# Patient Record
Sex: Male | Born: 1985 | Race: Black or African American | Hispanic: No | Marital: Single | Smoking: Current every day smoker
Health system: Southern US, Community
[De-identification: ages and names within clinical notes are randomized; demographics above are authoritative.]

## PROBLEM LIST (undated history)

## (undated) DIAGNOSIS — M109 Gout, unspecified: Secondary | ICD-10-CM

---
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
## 2006-06-07 ENCOUNTER — Emergency Department (HOSPITAL_COMMUNITY): Admission: EM | Admit: 2006-06-07 | Discharge: 2006-06-07 | Payer: Self-pay | Admitting: Emergency Medicine
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
## 2014-05-11 ENCOUNTER — Encounter (HOSPITAL_COMMUNITY): Payer: Self-pay | Admitting: Emergency Medicine

## 2014-05-11 ENCOUNTER — Emergency Department (HOSPITAL_COMMUNITY)
Admission: EM | Admit: 2014-05-11 | Discharge: 2014-05-11 | Disposition: A | Discharge status: Home / Self Care | Payer: Self-pay | Attending: Emergency Medicine | Admitting: Emergency Medicine

## 2014-05-11 DIAGNOSIS — R000 Tachycardia, unspecified: Secondary | ICD-10-CM | POA: Insufficient documentation

## 2014-05-11 DIAGNOSIS — F17200 Nicotine dependence, unspecified, uncomplicated: Secondary | ICD-10-CM | POA: Insufficient documentation

## 2014-05-11 DIAGNOSIS — J329 Chronic sinusitis, unspecified: Secondary | ICD-10-CM | POA: Insufficient documentation

## 2014-05-11 MED ORDER — SULFAMETHOXAZOLE-TRIMETHOPRIM 800-160 MG PO TABS: 1.0000 | ORAL_TABLET | Freq: Two times a day (BID) | ORAL | Status: DC

## 2014-05-11 NOTE — ED Provider Notes (Signed)
Medical screening examination/treatment/procedure(s) were performed by non-physician practitioner and as supervising physician I was immediately available for consultation/collaboration.   EKG Interpretation None        Dagmar Hait, MD 05/11/14 [phone]

## 2014-05-11 NOTE — Discharge Instructions (Signed)
Take Bactrim as directed until gone. Refer to attached documents for more information.  °

## 2014-05-11 NOTE — ED Notes (Signed)
Pt reports that he has had a cough and congestion over the past couple of days. Reports that he has fullness in his sinuses

## 2014-05-11 NOTE — ED Provider Notes (Signed)
CSN: [id number]     Arrival date & time 05/11/14  1454 History  This chart was scribed for non-physician practitioner Emilia Beck, PA-C working with Dagmar Hait, MD by Dorothey Baseman, ED Scribe. This patient was seen in room TR06C/TR06C and the patient's care was started at 3:30 PM.    Chief Complaint  Patient presents with  . Nasal Congestion  . Cough   Patient is a 28 y.o. male presenting with cough. The history is provided by the patient. No language interpreter was used.  Cough Cough characteristics:  Dry Severity:  Moderate Onset quality:  Gradual Timing:  Intermittent Progression:  Unchanged Chronicity:  New Smoker: yes   Relieved by:  Nothing Ineffective treatments: OTC medications. Associated symptoms: rhinorrhea, sinus congestion and sore throat   Rhinorrhea:    Quality:  Clear   Severity:  Mild   Timing:  Intermittent   Progression:  Unchanged Sore throat:    Severity:  Moderate   Onset quality:  Gradual   Timing:  Constant   Progression:  Unchanged  HPI Comments: Jesus Phillips is a 28 y.o. male who presents to the Emergency Department complaining of a dry cough with associated sore throat, congestion, rhinorrhea, and sinus pressure onset 2 days ago. He reports using using OTC medications at home without significant relief. Patient has no other pertinent medical history.   History reviewed. No pertinent past medical history. History reviewed. No pertinent past surgical history. History reviewed. No pertinent family history. History  Substance Use Topics  . Smoking status: Current Every Day Smoker    Types: Cigarettes  . Smokeless tobacco: Not on file  . Alcohol Use: Yes    Review of Systems  HENT: Positive for congestion, rhinorrhea, sinus pressure and sore throat.   Respiratory: Positive for cough.   All other systems reviewed and are negative.  Allergies  Review of patient's allergies indicates no known allergies.  Home Medications    Prior to Admission medications   Not on File   Triage Vitals: BP 136/75  Pulse 117  Temp(Src) 99.1 F (37.3 C) (Oral)  Resp 18  SpO2 97%  Physical Exam  Nursing note and vitals reviewed. Constitutional: He is oriented to person, place, and time. He appears well-developed and well-nourished. No distress.  HENT:  Head: Normocephalic and atraumatic.  Frontal sinus tenderness to palpation.   Eyes: Conjunctivae are normal.  Neck: Normal range of motion. Neck supple.  Cardiovascular: Regular rhythm and normal heart sounds.   Tachycardic.   Pulmonary/Chest: Effort normal and breath sounds normal. No respiratory distress.  Abdominal: He exhibits no distension.  Musculoskeletal: Normal range of motion.  Neurological: He is alert and oriented to person, place, and time.  Skin: Skin is warm and dry.  Psychiatric: He has a normal mood and affect. His behavior is normal.    ED Course  Procedures (including critical care time)  DIAGNOSTIC STUDIES: Oxygen Saturation is 97% on room air, normal by my interpretation.    COORDINATION OF CARE: 3:33 PM- Will start patient on antibiotics to treat a sinus infection. Discussed treatment plan with patient at bedside and patient verbalized agreement.     Labs Review Labs Reviewed - No data to display  Imaging Review No results found.   EKG Interpretation None      MDM   Final diagnoses:  Sinusitis    3:48 PM Patient will be treated with bactrim for sinusitis. Patient not tachycardic on my exam. Tachycardia likely due to congestion. No  chest xray needed at this time. Lungs are clear to auscultation.   I personally performed the services described in this documentation, which was scribed in my presence. The recorded information has been reviewed and is accurate.     Emilia BeckKaitlyn Shimon Trowbridge, PA-C 05/11/14 1549

## 2014-05-24 ENCOUNTER — Emergency Department (HOSPITAL_COMMUNITY)
Admission: EM | Admit: 2014-05-24 | Discharge: 2014-05-24 | Disposition: A | Discharge status: Home / Self Care | Payer: Self-pay | Attending: Emergency Medicine | Admitting: Emergency Medicine

## 2014-05-24 ENCOUNTER — Encounter (HOSPITAL_COMMUNITY): Payer: Self-pay | Admitting: Emergency Medicine

## 2014-05-24 DIAGNOSIS — M109 Gout, unspecified: Secondary | ICD-10-CM | POA: Insufficient documentation

## 2014-05-24 DIAGNOSIS — F17200 Nicotine dependence, unspecified, uncomplicated: Secondary | ICD-10-CM | POA: Insufficient documentation

## 2014-05-24 DIAGNOSIS — Z792 Long term (current) use of antibiotics: Secondary | ICD-10-CM | POA: Insufficient documentation

## 2014-05-24 MED ORDER — HYDROCODONE-ACETAMINOPHEN 5-325 MG PO TABS: 1.0000 | ORAL_TABLET | ORAL | Status: DC | PRN

## 2014-05-24 MED ORDER — INDOMETHACIN 25 MG PO CAPS: 25.0000 mg | ORAL_CAPSULE | Freq: Three times a day (TID) | ORAL | Status: DC | PRN

## 2014-05-24 MED ORDER — HYDROCODONE-ACETAMINOPHEN 5-325 MG PO TABS
1.0000 | ORAL_TABLET | Freq: Once | ORAL | Status: AC
  Administered 2014-05-24: 1 via ORAL
  Filled 2014-05-24: qty 1

## 2014-05-24 NOTE — ED Notes (Signed)
Pt in f/o left great toe pain, started Monday after working all night, denies specific injury, no redness or swelling

## 2014-05-24 NOTE — ED Provider Notes (Signed)
CSN: [id number]     Arrival date & time 05/24/14  1345 History  This chart was scribed for non-physician practitioner, Elpidio Anis, PA-C working with Suzi Roots, MD by Greggory Stallion, ED scribe. This patient was seen in room TR10C/TR10C and the patient's care was started at 2:16 PM.   Chief Complaint  Patient presents with  . Toe Pain   The history is provided by the patient. No language interpreter was used.   HPI Comments: Jesus Phillips is a 28 y.o. male who presents to the Emergency Department complaining of gradual onset, burning left great toe pain that started yesterday after working the night before. Denies specific injury. States his toe feels warm to touch. Bearing weight worsens the pain. Pt has not taken anything for his symptoms. Denies toe swelling.   History reviewed. No pertinent past medical history. History reviewed. No pertinent past surgical history. History reviewed. No pertinent family history. History  Substance Use Topics  . Smoking status: Current Every Day Smoker    Types: Cigarettes  . Smokeless tobacco: Not on file  . Alcohol Use: Yes    Review of Systems  Musculoskeletal: Positive for arthralgias. Negative for joint swelling.  All other systems reviewed and are negative.  Allergies  Review of patient's allergies indicates no known allergies.  Home Medications   Prior to Admission medications   Medication Sig Start Date End Date Taking? Authorizing Provider  sulfamethoxazole-trimethoprim (SEPTRA DS) 800-160 MG per tablet Take 1 tablet by mouth every 12 (twelve) hours. 05/11/14   Kaitlyn Szekalski, PA-C   BP 143/97  Pulse 80  Temp(Src) 98.1 F (36.7 C) (Oral)  Resp 20  Wt 225 lb (102.059 kg)  SpO2 100%  Physical Exam  Nursing note and vitals reviewed. Constitutional: He is oriented to person, place, and time. He appears well-developed and well-nourished. No distress.  HENT:  Head: Normocephalic and atraumatic.  Eyes: Conjunctivae and  EOM are normal.  Neck: Normal range of motion. No tracheal deviation present.  Cardiovascular: Normal rate.   Pulmonary/Chest: Effort normal. No respiratory distress.  Musculoskeletal: Normal range of motion.  Metatarsal phalangeal joint of great toe is erythematous. Mild swelling noted. Severely tender to palpation. Area is warm to touch.   Neurological: He is alert and oriented to person, place, and time.  Skin: Skin is warm and dry.  Psychiatric: He has a normal mood and affect. His behavior is normal.    ED Course  Procedures (including critical care time)  DIAGNOSTIC STUDIES: Oxygen Saturation is 100% on RA, normal by my interpretation.    COORDINATION OF CARE: 2:19 PM-Discussed treatment plan which includes indocin and vicodin with pt at bedside and pt agreed to plan.   Labs Review Labs Reviewed - No data to display  Imaging Review No results found.   EKG Interpretation None      MDM   Final diagnoses:  None    1. Gout  Non-traumatic pain to left 1st MT joint c/w gout. Indomethacin and NOrco for pain. Refer to PCP.  I personally performed the services described in this documentation, which was scribed in my presence. The recorded information has been reviewed and is accurate.  Arnoldo Hooker, PA-C 05/24/14 1436

## 2014-05-24 NOTE — ED Notes (Signed)
Pt reports left big toe pain since yesterday am. Denies injuring toe/foot. Big toe swollen, erythematous.

## 2014-05-24 NOTE — Discharge Instructions (Signed)
Gout °Gout is when your joints become red, sore, and swell (inflammed). This is caused by the buildup of uric acid crystals in the joints. Uric acid is a chemical that is normally in the blood. If the level of uric acid gets too high in the blood, these crystals form in your joints and tissues. Over time, these crystals can form into masses near the joints and tissues. These masses can destroy bone and cause the bone to look misshapen (deformed). °HOME CARE  °· Do not take aspirin for pain. °· Only take medicine as told by your doctor. °· Rest the joint as much as you can. When in bed, keep sheets and blankets off painful areas. °· Keep the sore joints raised (elevated). °· Put warm or cold packs on painful joints. Use of warm or cold packs depends on which works best for you. °· Use crutches if the painful joint is in your leg. °· Drink enough fluids to keep your pee (urine) clear or pale yellow. Limit alcohol, sugary drinks, and drinks with fructose in them. °· Follow your diet instructions. Pay careful attention to how much protein you eat. Include fruits, vegetables, whole grains, and fat-free or low-fat milk products in your daily diet. Talk to your doctor or dietician about the use of coffee, vitamin C, and cherries. These may help lower uric acid levels. °· Keep a healthy body weight. °GET HELP RIGHT AWAY IF:  °· You have watery poop (diarrhea), throw up (vomit), or have any side effects from medicines. °· You do not feel better in 24 hours, or you are getting worse. °· Your joint becomes suddenly more tender, and you have chills or a fever. °MAKE SURE YOU:  °· Understand these instructions. °· Will watch your condition. °· Will get help right away if you are not doing well or get worse. °Document Released: 09/10/2008 Document Revised: 03/29/2013 Document Reviewed: 03/12/2010 °ExitCare® Patient Information ©2014 [employer]. ° °Purine Restricted Diet °A low-purine diet consists of foods that reduce uric  acid made in your body. °INDICATIONS FOR USE  °Your caregiver may ask you to follow a low-purine diet to reduce gout flairs.  °GUIDELINES  °Avoid high-purine foods, including all alcohol, yeast extracts taken as supplements, and sauces made from meats (like gravy). Do not eat high-purine meats, including anchovies, sardines, herring, mussels, tuna, codfish, scallops, trout, haddock, bacon, organ meats, tripe, goose, wild game, and sweetbreads.  °Grains °· Allowed/Recommended: All, except those listed to consume in moderation. °· Consume in Moderation: Oatmeal ( cup uncooked daily), wheat bran or germ (¼ cup daily), and whole grains. °Vegetables °· Allowed/Recommended: All, except those listed to consume in moderation. °· Consume in Moderation: Asparagus, cauliflower, spinach, mushrooms, and green peas (½ cup daily). °Fruit °· Allowed/Recommended: All. °· Consume in Moderation: None. °Meat and Meat Substitutes °· Allowed/Recommended: Eggs, nuts, and peanut butter. °· Consume in Moderation: Limit to 4 to 6 oz daily. Avoid high-purine meats. Lentils, peas, and dried beans (1 cup daily). °Milk °· Allowed/Recommended: All. Choose low-fat or skim when possible. °· Consume in Moderation: None. °Fats and Oils °· Allowed/Recommended: All. °· Consume in Moderation: None. °Beverages °· Allowed/Recommended: All, except those listed to avoid. °· Avoid: All alcohol. °Condiments/Miscellaneous °· Allowed/Recommended: All, except those listed to consume in moderation. °· Consume in Moderation: Bouillon and meat-based broths and soups. °Document Released: 03/29/2011 Document Revised: 02/24/2012 Document Reviewed: 03/29/2011 °ExitCare® Patient Information ©2014 [employer]. ° °

## 2014-05-25 NOTE — Discharge Planning (Signed)
[facility] Liaison was not able to see patient, [facility] orange card information and resource guide will be mailed to the address listed. °

## 2014-05-25 NOTE — ED Provider Notes (Signed)
Medical screening examination/treatment/procedure(s) were performed by non-physician practitioner and as supervising physician I was immediately available for consultation/collaboration.     Suzi Roots, MD 05/25/14 [phone]

## 2015-01-05 ENCOUNTER — Emergency Department (HOSPITAL_COMMUNITY): Payer: Self-pay

## 2015-01-05 ENCOUNTER — Encounter (HOSPITAL_COMMUNITY): Payer: Self-pay | Admitting: Emergency Medicine

## 2015-01-05 ENCOUNTER — Emergency Department (HOSPITAL_COMMUNITY)
Admission: EM | Admit: 2015-01-05 | Discharge: 2015-01-05 | Disposition: A | Discharge status: Home / Self Care | Payer: Self-pay | Attending: Emergency Medicine | Admitting: Emergency Medicine

## 2015-01-05 DIAGNOSIS — S0101XA Laceration without foreign body of scalp, initial encounter: Secondary | ICD-10-CM | POA: Insufficient documentation

## 2015-01-05 DIAGNOSIS — Y9389 Activity, other specified: Secondary | ICD-10-CM | POA: Insufficient documentation

## 2015-01-05 DIAGNOSIS — Y9289 Other specified places as the place of occurrence of the external cause: Secondary | ICD-10-CM | POA: Insufficient documentation

## 2015-01-05 DIAGNOSIS — S0292XA Unspecified fracture of facial bones, initial encounter for closed fracture: Secondary | ICD-10-CM | POA: Insufficient documentation

## 2015-01-05 DIAGNOSIS — Z23 Encounter for immunization: Secondary | ICD-10-CM | POA: Insufficient documentation

## 2015-01-05 DIAGNOSIS — Z79899 Other long term (current) drug therapy: Secondary | ICD-10-CM | POA: Insufficient documentation

## 2015-01-05 DIAGNOSIS — S0990XA Unspecified injury of head, initial encounter: Secondary | ICD-10-CM

## 2015-01-05 DIAGNOSIS — S01111A Laceration without foreign body of right eyelid and periocular area, initial encounter: Secondary | ICD-10-CM | POA: Insufficient documentation

## 2015-01-05 DIAGNOSIS — Z791 Long term (current) use of non-steroidal anti-inflammatories (NSAID): Secondary | ICD-10-CM | POA: Insufficient documentation

## 2015-01-05 DIAGNOSIS — Y998 Other external cause status: Secondary | ICD-10-CM | POA: Insufficient documentation

## 2015-01-05 DIAGNOSIS — Z720 Tobacco use: Secondary | ICD-10-CM | POA: Insufficient documentation

## 2015-01-05 MED ORDER — LIDOCAINE HCL (PF) 1 % IJ SOLN
30.0000 mL | Freq: Once | INTRAMUSCULAR | Status: AC
  Administered 2015-01-05: 30 mL
  Filled 2015-01-05: qty 30

## 2015-01-05 MED ORDER — OXYCODONE-ACETAMINOPHEN 5-325 MG PO TABS: 1.0000 | ORAL_TABLET | ORAL | Status: DC | PRN

## 2015-01-05 MED ORDER — OXYCODONE-ACETAMINOPHEN 5-325 MG PO TABS
1.0000 | ORAL_TABLET | Freq: Once | ORAL | Status: AC
  Administered 2015-01-05: 1 via ORAL
  Filled 2015-01-05: qty 1

## 2015-01-05 MED ORDER — TETANUS-DIPHTH-ACELL PERTUSSIS 5-2.5-18.5 LF-MCG/0.5 IM SUSP
0.5000 mL | Freq: Once | INTRAMUSCULAR | Status: AC
  Administered 2015-01-05: 0.5 mL via INTRAMUSCULAR
  Filled 2015-01-05: qty 0.5
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
## 2015-01-05 NOTE — ED Provider Notes (Signed)
Patient seen and treated by Dr Ranae PalmsYelverton.  Please see his notes for full details of this visit.  I was asked to repair facial wound. Pt able to move right eye in all directions.  Denies FB sensation or eye pain.    LACERATION REPAIR Performed by: Trixie DredgeWEST, Mavery Milling Authorized by: Trixie DredgeWEST, Arjun Hard Consent: Verbal consent obtained. Risks and benefits: risks, benefits and alternatives were discussed Consent given by: patient Patient identity confirmed: provided demographic data Prepped and Draped in normal sterile fashion Wound explored  Laceration Location: right eyelid  Laceration Length: 5cm  No Foreign Bodies seen or palpated  Anesthesia: local infiltration  Local anesthetic: lidocaine 1% without epinephrine  Anesthetic total: 8 ml  Irrigation method: syringe Amount of cleaning: standard  Skin closure: 5-0 ethilon  Number of sutures: 6  Technique: simple interrupted  Patient tolerance: Patient tolerated the procedure well with no immediate complications.   Discussed wound care with patient.  Discussed return precautions.  Pt to follow up with ENT in 5 days.  Discharge and Rx by Dr Ranae PalmsYelverton.   LambertvilleEmily Jerryl Holzhauer, PA-C 01/05/15 [phone]  Hilario Quarryanielle S Ray, MD 01/06/15 [phone]

## 2015-01-05 NOTE — ED Notes (Signed)
Patient transported to CT SCAN . 
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
## 2015-01-05 NOTE — Discharge Instructions (Signed)
Call and arrange follow-up appointment with Dr. Jearld Fenton in 5 days. Your sutures will need to be removed in 5-7 days. Take medication as prescribed. Avoid repeat head injury and gradually increase activity to normal level as your symptoms improved. Return immediately for worsening pain, persistent vomiting, visual changes, focal weakness or for any concerns.  Concussion A concussion, or closed-head injury, is a brain injury caused by a direct blow to the head or by a quick and sudden movement (jolt) of the head or neck. Concussions are usually not life-threatening. Even so, the effects of a concussion can be serious. If you have had a concussion before, you are more likely to experience concussion-like symptoms after a direct blow to the head.  CAUSES  Direct blow to the head, such as from running into another player during a soccer game, being hit in a fight, or hitting your head on a hard surface.  A jolt of the head or neck that causes the brain to move back and forth inside the skull, such as in a car crash. SIGNS AND SYMPTOMS The signs of a concussion can be hard to notice. Early on, they may be missed by you, family members, and health care providers. You may look fine but act or feel differently. Symptoms are usually temporary, but they may last for days, weeks, or even longer. Some symptoms may appear right away while others may not show up for hours or days. Every head injury is different. Symptoms include:  Mild to moderate headaches that will not go away.  A feeling of pressure inside your head.  Having more trouble than usual:  Learning or remembering things you have heard.  Answering questions.  Paying attention or concentrating.  Organizing daily tasks.  Making decisions and solving problems.  Slowness in thinking, acting or reacting, speaking, or reading.  Getting lost or being easily confused.  Feeling tired all the time or lacking energy (fatigued).  Feeling  drowsy.  Sleep disturbances.  Sleeping more than usual.  Sleeping less than usual.  Trouble falling asleep.  Trouble sleeping (insomnia).  Loss of balance or feeling lightheaded or dizzy.  Nausea or vomiting.  Numbness or tingling.  Increased sensitivity to:  Sounds.  Lights.  Distractions.  Vision problems or eyes that tire easily.  Diminished sense of taste or smell.  Ringing in the ears.  Mood changes such as feeling sad or anxious.  Becoming easily irritated or angry for little or no reason.  Lack of motivation.  Seeing or hearing things other people do not see or hear (hallucinations). DIAGNOSIS Your health care provider can usually diagnose a concussion based on a description of your injury and symptoms. He or she will ask whether you passed out (lost consciousness) and whether you are having trouble remembering events that happened right before and during your injury. Your evaluation might include:  A brain scan to look for signs of injury to the brain. Even if the test shows no injury, you may still have a concussion.  Blood tests to be sure other problems are not present. TREATMENT  Concussions are usually treated in an emergency department, in urgent care, or at a clinic. You may need to stay in the hospital overnight for further treatment.  Tell your health care provider if you are taking any medicines, including prescription medicines, over-the-counter medicines, and natural remedies. Some medicines, such as blood thinners (anticoagulants) and aspirin, may increase the chance of complications. Also tell your health care provider whether you  have had alcohol or are taking illegal drugs. This information may affect treatment.  Your health care provider will send you home with important instructions to follow.  How fast you will recover from a concussion depends on many factors. These factors include how severe your concussion is, what part of your brain  was injured, your age, and how healthy you were before the concussion.  Most people with mild injuries recover fully. Recovery can take time. In general, recovery is slower in older persons. Also, persons who have had a concussion in the past or have other medical problems may find that it takes longer to recover from their current injury. HOME CARE INSTRUCTIONS General Instructions  Carefully follow the directions your health care provider gave you.  Only take over-the-counter or prescription medicines for pain, discomfort, or fever as directed by your health care provider.  Take only those medicines that your health care provider has approved.  Do not drink alcohol until your health care provider says you are well enough to do so. Alcohol and certain other drugs may slow your recovery and can put you at risk of further injury.  If it is harder than usual to remember things, write them down.  If you are easily distracted, try to do one thing at a time. For example, do not try to watch TV while fixing dinner.  Talk with family members or close friends when making important decisions.  Keep all follow-up appointments. Repeated evaluation of your symptoms is recommended for your recovery.  Watch your symptoms and tell others to do the same. Complications sometimes occur after a concussion. Older adults with a brain injury may have a higher risk of serious complications, such as a blood clot on the brain.  Tell your teachers, school nurse, school counselor, coach, athletic trainer, or work Production designer, theatre/television/filmmanager about your injury, symptoms, and restrictions. Tell them about what you can or cannot do. They should watch for:  Increased problems with attention or concentration.  Increased difficulty remembering or learning new information.  Increased time needed to complete tasks or assignments.  Increased irritability or decreased ability to cope with stress.  Increased symptoms.  Rest. Rest helps  the brain to heal. Make sure you:  Get plenty of sleep at night. Avoid staying up late at night.  Keep the same bedtime hours on weekends and weekdays.  Rest during the day. Take daytime naps or rest breaks when you feel tired.  Limit activities that require a lot of thought or concentration. These include:  Doing homework or job-related work.  Watching TV.  Working on the computer.  Avoid any situation where there is potential for another head injury (football, hockey, soccer, basketball, martial arts, downhill snow sports and horseback riding). Your condition will get worse every time you experience a concussion. You should avoid these activities until you are evaluated by the appropriate follow-up health care providers. Returning To Your Regular Activities You will need to return to your normal activities slowly, not all at once. You must give your body and brain enough time for recovery.  Do not return to sports or other athletic activities until your health care provider tells you it is safe to do so.  Ask your health care provider when you can drive, ride a bicycle, or operate heavy machinery. Your ability to react may be slower after a brain injury. Never do these activities if you are dizzy.  Ask your health care provider about when you can return to work  or school. Preventing Another Concussion It is very important to avoid another brain injury, especially before you have recovered. In rare cases, another injury can lead to permanent brain damage, brain swelling, or death. The risk of this is greatest during the first 7-10 days after a head injury. Avoid injuries by:  Wearing a seat belt when riding in a car.  Drinking alcohol only in moderation.  Wearing a helmet when biking, skiing, skateboarding, skating, or doing similar activities.  Avoiding activities that could lead to a second concussion, such as contact or recreational sports, until your health care provider says  it is okay.  Taking safety measures in your home.  Remove clutter and tripping hazards from floors and stairways.  Use grab bars in bathrooms and handrails by stairs.  Place non-slip mats on floors and in bathtubs.  Improve lighting in dim areas. SEEK MEDICAL CARE IF:  You have increased problems paying attention or concentrating.  You have increased difficulty remembering or learning new information.  You need more time to complete tasks or assignments than before.  You have increased irritability or decreased ability to cope with stress.  You have more symptoms than before. Seek medical care if you have any of the following symptoms for more than 2 weeks after your injury:  Lasting (chronic) headaches.  Dizziness or balance problems.  Nausea.  Vision problems.  Increased sensitivity to noise or light.  Depression or mood swings.  Anxiety or irritability.  Memory problems.  Difficulty concentrating or paying attention.  Sleep problems.  Feeling tired all the time. SEEK IMMEDIATE MEDICAL CARE IF:  You have severe or worsening headaches. These may be a sign of a blood clot in the brain.  You have weakness (even if only in one hand, leg, or part of the face).  You have numbness.  You have decreased coordination.  You vomit repeatedly.  You have increased sleepiness.  One pupil is larger than the other.  You have convulsions.  You have slurred speech.  You have increased confusion. This may be a sign of a blood clot in the brain.  You have increased restlessness, agitation, or irritability.  You are unable to recognize people or places.  You have neck pain.  It is difficult to wake you up.  You have unusual behavior changes.  You lose consciousness. MAKE SURE YOU:  Understand these instructions.  Will watch your condition.  Will get help right away if you are not doing well or get worse. Document Released: 02/22/2004 Document Revised:  12/07/2013 Document Reviewed: 06/24/2013 [facility] Patient Information 2015 [place], [place]. This information is not intended to replace advice given to you by your health care provider. Make sure you discuss any questions you have with your health care provider.  Facial Fracture A facial fracture is a break in one of the bones of your face. HOME CARE INSTRUCTIONS   Protect the injured part of your face until it is healed.  Do not participate in activities which give chance for re-injury until your doctor approves.  Gently wash and dry your face.  Wear head and facial protection while riding a bicycle, motorcycle, or snowmobile. SEEK MEDICAL CARE IF:   An oral temperature above 102 F (38.9 C) develops.  You have severe headaches or notice changes in your vision.  You have new numbness or tingling in your face.  You develop nausea (feeling sick to your stomach), vomiting or a stiff neck. SEEK IMMEDIATE MEDICAL CARE IF:   You  develop difficulty seeing or experience double vision.  You become dizzy, lightheaded, or faint.  You develop trouble speaking, breathing, or swallowing.  You have a watery discharge from your nose or ear. MAKE SURE YOU:   Understand these instructions.  Will watch your condition.  Will get help right away if you are not doing well or get worse. Document Released: 12/02/2005 Document Revised: 02/24/2012 Document Reviewed: 07/21/2008 [facility] Patient Information 2015 [place], [place]. This information is not intended to replace advice given to you by your health care provider. Make sure you discuss any questions you have with your health care provider.  Facial Laceration  A facial laceration is a cut on the face. These injuries can be painful and cause bleeding. Lacerations usually heal quickly, but they need special care to reduce scarring. DIAGNOSIS  Your health care provider will take a medical history, ask for details about how the injury occurred,  and examine the wound to determine how deep the cut is. TREATMENT  Some facial lacerations may not require closure. Others may not be able to be closed because of an increased risk of infection. The risk of infection and the chance for successful closure will depend on various factors, including the amount of time since the injury occurred. The wound may be cleaned to help prevent infection. If closure is appropriate, pain medicines may be given if needed. Your health care provider will use stitches (sutures), wound glue (adhesive), or skin adhesive strips to repair the laceration. These tools bring the skin edges together to allow for faster healing and a better cosmetic outcome. If needed, you may also be given a tetanus shot. HOME CARE INSTRUCTIONS  Only take over-the-counter or prescription medicines as directed by your health care provider.  Follow your health care provider's instructions for wound care. These instructions will vary depending on the technique used for closing the wound. For Sutures:  Keep the wound clean and dry.   If you were given a bandage (dressing), you should change it at least once a day. Also change the dressing if it becomes wet or dirty, or as directed by your health care provider.   Wash the wound with soap and water 2 times a day. Rinse the wound off with water to remove all soap. Pat the wound dry with a clean towel.   After cleaning, apply a thin layer of the antibiotic ointment recommended by your health care provider. This will help prevent infection and keep the dressing from sticking.   You may shower as usual after the first 24 hours. Do not soak the wound in water until the sutures are removed.   Get your sutures removed as directed by your health care provider. With facial lacerations, sutures should usually be taken out after 4-5 days to avoid stitch marks.   Wait a few days after your sutures are removed before applying any makeup. For Skin  Adhesive Strips:  Keep the wound clean and dry.   Do not get the skin adhesive strips wet. You may bathe carefully, using caution to keep the wound dry.   If the wound gets wet, pat it dry with a clean towel.   Skin adhesive strips will fall off on their own. You may trim the strips as the wound heals. Do not remove skin adhesive strips that are still stuck to the wound. They will fall off in time.  For Wound Adhesive:  You may briefly wet your wound in the shower or bath. Do not  soak or scrub the wound. Do not swim. Avoid periods of heavy sweating until the skin adhesive has fallen off on its own. After showering or bathing, gently pat the wound dry with a clean towel.   Do not apply liquid medicine, cream medicine, ointment medicine, or makeup to your wound while the skin adhesive is in place. This may loosen the film before your wound is healed.   If a dressing is placed over the wound, be careful not to apply tape directly over the skin adhesive. This may cause the adhesive to be pulled off before the wound is healed.   Avoid prolonged exposure to sunlight or tanning lamps while the skin adhesive is in place.  The skin adhesive will usually remain in place for 5-10 days, then naturally fall off the skin. Do not pick at the adhesive film.  After Healing: Once the wound has healed, cover the wound with sunscreen during the day for 1 full year. This can help minimize scarring. Exposure to ultraviolet light in the first year will darken the scar. It can take 1-2 years for the scar to lose its redness and to heal completely.  SEEK IMMEDIATE MEDICAL CARE IF:  You have redness, pain, or swelling around the wound.   You see ayellowish-white fluid (pus) coming from the wound.   You have chills or a fever.  MAKE SURE YOU:  Understand these instructions.  Will watch your condition.  Will get help right away if you are not doing well or get worse. Document Released: 01/09/2005  Document Revised: 09/22/2013 Document Reviewed: 07/15/2013 [facility] Patient Information 2015 [place], [place]. This information is not intended to replace advice given to you by your health care provider. Make sure you discuss any questions you have with your health care provider.  Orbital Floor Fracture, Non-Blowout The eye sits in the part of the skull called the "orbit." The upper and outside walls of the orbit are thick and strong. The inside wall (near the nose) and the orbital floor are very thin and weak. The tissues around the eye will briefly press together if there is a direct blow to the front of the eye. This leads to high pressure against the orbital walls. The inside wall and the orbital floor may break since these are the weakest walls. If the orbital floor breaks, the tissues around the eye, including the muscle that makes the eye look down, may become trapped in the sinus below when the orbital floor "blows out." If a blowout does not happen, the orbital floor fracture is considered a non-blowout orbital fracture. CAUSES An orbital floor fracture can be caused by any accident in which an object hits the face or the face strikes against a hard object. The most common ways that people break their eye socket include:  Being hit by a blunt object, such as a baseball bat or a fist.  Striking the face on the car dashboard during a crash.  Falls.  Gunshot. SYMPTOMS  If there has been no injury to the eye itself, symptoms may include:  Puffiness (swelling) and bruising around the eye area (black eye).  Numbness of the cheek and upper gum on the side with the floor fracture. This is caused by nerve injury to these areas.  Pain around the eye.  Headache.  Ear pain on the injured side. DIAGNOSIS The diagnosis of an orbital floor fracture is suspected during an eye exam by an ophthalmologist. It is confirmed by X-rays or CT scan.  TREATMENT Your caregiver may suggest waiting 1 or 2  weeks for the swelling to go away before examining the eye. When the swelling lessens, your caregiver will examine the eye to see if there is any sign of a trapped muscle or double vision when looking in different directions. If double vision is not found and muscle or tissue did not get trapped, no further treatment is necessary. After that, in almost all cases, the bones heal together on their own.  HOME CARE INSTRUCTIONS  Take all pain medicine as directed by your caregiver.  Use ice packs or other cold therapy to reduce swelling as directed by your caregiver.  Do not put a contact lens in the injured eye until your caregiver approves.  Avoid dusty environments.  Always wear protective glasses or goggles when recommended. Wearing protective eyewear is not dangerous to your injured eye and will not delay healing.  As long as your other eye is seeing normally, you may return to work and drive.  You may travel by plane or be in high altitudes. However, your swelling may take longer to go away, and you may have sinus pain.  Be aware that your depth perception and your ability to judge distance may be reduced or lost. SEEK IMMEDIATE MEDICAL CARE IF:  Your vision changes.  Your redness or swelling persists around the injured eye or gets worse.  You start to have double vision.  You have a bloody or discolored discharge from your nose.  You have a fever that lasts longer than 2 to 3 days.  You have a fever that suddenly gets worse.  Your cheek or upper gum numbness does not go away. MAKE SURE YOU:  Understand these instructions.  Will watch your condition.  Will get help right away if you are not doing well or get worse. Document Released: 02/24/2012 Document Revised: 04/18/2014 Document Reviewed: 02/24/2012 [facility] Patient Information 2015 [place], [place]. This information is not intended to replace advice given to you by your health care provider. Make sure you discuss any  questions you have with your health care provider.

## 2015-06-16 ENCOUNTER — Emergency Department (HOSPITAL_COMMUNITY)
Admission: EM | Admit: 2015-06-16 | Discharge: 2015-06-16 | Disposition: A | Discharge status: Home / Self Care | Payer: Self-pay | Attending: Emergency Medicine | Admitting: Emergency Medicine

## 2015-06-16 ENCOUNTER — Encounter (HOSPITAL_COMMUNITY): Payer: Self-pay | Admitting: *Deleted

## 2015-06-16 DIAGNOSIS — Z720 Tobacco use: Secondary | ICD-10-CM | POA: Insufficient documentation

## 2015-06-16 DIAGNOSIS — M109 Gout, unspecified: Secondary | ICD-10-CM | POA: Insufficient documentation

## 2015-06-16 HISTORY — DX: Gout, unspecified: M10.9

## 2015-06-16 MED ORDER — INDOMETHACIN 25 MG PO CAPS: 25.0000 mg | ORAL_CAPSULE | Freq: Three times a day (TID) | ORAL | Status: DC | PRN

## 2015-06-16 NOTE — ED Notes (Signed)
Pt presents via POV c/o gout flare up in left foot beginning yesterday. Pt also reports he lost his gout medication and needs a new prescription.  Pt a x 4, NAD.

## 2015-06-16 NOTE — Discharge Instructions (Signed)
Gout °Gout is when your joints become red, sore, and swell (inflamed). This is caused by the buildup of uric acid crystals in the joints. Uric acid is a chemical that is normally in the blood. If the level of uric acid gets too high in the blood, these crystals form in your joints and tissues. Over time, these crystals can form into masses near the joints and tissues. These masses can destroy bone and cause the bone to look misshapen (deformed). °HOME CARE  °· Do not take aspirin for pain. °· Only take medicine as told by your doctor. °· Rest the joint as much as you can. When in bed, keep sheets and blankets off painful areas. °· Keep the sore joints raised (elevated). °· Put warm or cold packs on painful joints. Use of warm or cold packs depends on which works best for you. °· Use crutches if the painful joint is in your leg. °· Drink enough fluids to keep your pee (urine) clear or pale yellow. Limit alcohol, sugary drinks, and drinks with fructose in them. °· Follow your diet instructions. Pay careful attention to how much protein you eat. Include fruits, vegetables, whole grains, and fat-free or low-fat milk products in your daily diet. Talk to your doctor or dietitian about the use of coffee, vitamin C, and cherries. These may help lower uric acid levels. °· Keep a healthy body weight. °GET HELP RIGHT AWAY IF:  °· You have watery poop (diarrhea), throw up (vomit), or have any side effects from medicines. °· You do not feel better in 24 hours, or you are getting worse. °· Your joint becomes suddenly more tender, and you have chills or a fever. °MAKE SURE YOU:  °· Understand these instructions. °· Will watch your condition. °· Will get help right away if you are not doing well or get worse. °Document Released: 09/10/2008 Document Revised: 04/18/2014 Document Reviewed: 07/15/2012 °[employer]® Patient Information ©2015 [employer]. This information is not intended to replace advice given to you by your health care  provider. Make sure you discuss any questions you have with your health care provider. ° °

## 2015-06-16 NOTE — ED Provider Notes (Signed)
CSN: [id number]     Arrival date & time 06/16/15  4098 History  This chart was scribed for Teressa Lower, NP, working with Donnetta Hutching, MD by Elon Spanner, ED Scribe. This patient was seen in room TR07C/TR07C and the patient's care was started at 9:33 AM.   Chief Complaint  Patient presents with  . Gout   The history is provided by the patient. No language interpreter was used.   HPI Comments: Jesus Phillips is a 29 y.o. male with a history of gout who presents to the Emergency Department complaining of worsening left foot pain primarily in the base of the big toe onset yesterday described as his typical gout flare.  He has iced and elevated the complaint without relief.  Bearing weight aggravates the pain.  He is prescribed medication for gout but lost his medication.     No past medical history on file. No past surgical history on file. No family history on file. History  Substance Use Topics  . Smoking status: Current Every Day Smoker    Types: Cigarettes  . Smokeless tobacco: Not on file  . Alcohol Use: Yes    Review of Systems  Musculoskeletal: Positive for arthralgias.  All other systems reviewed and are negative.     Allergies  Review of patient's allergies indicates no known allergies.  Home Medications   Prior to Admission medications   Medication Sig Start Date End Date Taking? Authorizing Provider  indomethacin (INDOCIN) 25 MG capsule Take 1 capsule (25 mg total) by mouth 3 (three) times daily as needed. Patient taking differently: Take 25 mg by mouth 3 (three) times daily as needed for mild pain.  05/24/14   Elpidio Anis, PA-C  oxyCODONE-acetaminophen (PERCOCET) 5-325 MG per tablet Take 1-2 tablets by mouth every 4 (four) hours as needed for moderate pain or severe pain. 01/05/15   Loren Racer, MD  sulfamethoxazole-trimethoprim (SEPTRA DS) 800-160 MG per tablet Take 1 tablet by mouth every 12 (twelve) hours. Patient not taking: Reported on 01/05/2015 05/11/14    Kaitlyn Szekalski, PA-C   BP 144/94 mmHg  Pulse 109  Temp(Src) 98.2 F (36.8 C) (Oral)  Resp 16  SpO2 99% Physical Exam  Constitutional: He is oriented to person, place, and time. He appears well-developed and well-nourished. No distress.  HENT:  Head: Normocephalic and atraumatic.  Eyes: Conjunctivae and EOM are normal.  Neck: Neck supple. No tracheal deviation present.  Cardiovascular: Normal rate.   Pulmonary/Chest: Effort normal. No respiratory distress.  Musculoskeletal: Normal range of motion.  Swelling redness and warmth noted to the left great toe. Tender to palpation. Neurovascularly intact  Neurological: He is alert and oriented to person, place, and time.  Skin: Skin is warm and dry.  Psychiatric: He has a normal mood and affect. His behavior is normal.  Nursing note and vitals reviewed.   ED Course  Procedures (including critical care time)  DIAGNOSTIC STUDIES: Oxygen Saturation is 99% on RA, normal by my interpretation.    COORDINATION OF CARE:  9:37 AM Discussed treatment plan with patient at bedside.  Patient acknowledges and agrees with plan.    Labs Review Labs Reviewed - No data to display  Imaging Review No results found.   EKG Interpretation None      MDM   Final diagnoses:  Gout of big toe    Will treat with indomethacin. Exam consistent with gout  I personally performed the services described in this documentation, which was scribed in my presence. The  recorded information has been reviewed and is accurate.    Teressa LowerVrinda Mahmoud Blazejewski, NP 06/16/15 [phone]  Donnetta HutchingBrian Cook, MD 06/16/15 [phone]

## 2015-06-21 ENCOUNTER — Emergency Department (HOSPITAL_COMMUNITY)
Admission: EM | Admit: 2015-06-21 | Discharge: 2015-06-21 | Disposition: A | Discharge status: Home / Self Care | Payer: Self-pay | Attending: Emergency Medicine | Admitting: Emergency Medicine

## 2015-06-21 ENCOUNTER — Encounter (HOSPITAL_COMMUNITY): Payer: Self-pay | Admitting: Emergency Medicine

## 2015-06-21 DIAGNOSIS — Z720 Tobacco use: Secondary | ICD-10-CM | POA: Insufficient documentation

## 2015-06-21 DIAGNOSIS — M109 Gout, unspecified: Secondary | ICD-10-CM | POA: Insufficient documentation

## 2015-06-21 MED ORDER — INDOMETHACIN 25 MG PO CAPS: 25.0000 mg | ORAL_CAPSULE | Freq: Three times a day (TID) | ORAL | Status: DC | PRN

## 2015-06-21 NOTE — ED Provider Notes (Signed)
CSN: [id number]     Arrival date & time 06/21/15  0458 History   First MD Initiated Contact with Patient 06/21/15 0557     Chief Complaint  Patient presents with  . Gout     (Consider location/radiation/quality/duration/timing/severity/associated sxs/prior Treatment) HPI Complains of pain at left great toe onset 6 days ago typical Kelly's had in the past. Patient seen in the emergency department 7 12/2014, prescribed indomethacin. He reports that his daughter spelled his medications. No other associated symptoms. Pain worse with pressure not improved by anything. Nonradiating Past Medical History  Diagnosis Date  . Gout    History reviewed. No pertinent past surgical history. History reviewed. No pertinent family history. History  Substance Use Topics  . Smoking status: Current Every Day Smoker    Types: Cigarettes  . Smokeless tobacco: Not on file  . Alcohol Use: Yes    Review of Systems  Constitutional: Negative.   Musculoskeletal: Positive for arthralgias.       Pain at left great toe  Allergic/Immunologic: Negative.   Neurological: Negative.       Allergies  Review of patient's allergies indicates no known allergies.  Home Medications   Prior to Admission medications   Medication Sig Start Date End Date Taking? Authorizing Provider  indomethacin (INDOCIN) 25 MG capsule Take 1 capsule (25 mg total) by mouth 3 (three) times daily as needed. 06/16/15   Teressa LowerVrinda Pickering, NP  oxyCODONE-acetaminophen (PERCOCET) 5-325 MG per tablet Take 1-2 tablets by mouth every 4 (four) hours as needed for moderate pain or severe pain. 01/05/15   Loren Raceravid Yelverton, MD  sulfamethoxazole-trimethoprim (SEPTRA DS) 800-160 MG per tablet Take 1 tablet by mouth every 12 (twelve) hours. Patient not taking: Reported on 01/05/2015 05/11/14   Emilia BeckKaitlyn Szekalski, PA-C   BP 167/114 mmHg  Pulse 81  Temp(Src) 98.3 F (36.8 C) (Oral)  Resp 17  Ht 6\' 3"  (1.905 m)  Wt 250 lb (113.399 kg)  BMI 31.25 kg/m2   SpO2 100% Physical Exam  Constitutional: He appears well-developed and well-nourished. No distress.  HENT:  Head: Normocephalic and atraumatic.  Right Ear: External ear normal.  Left Ear: External ear normal.  Eyes: EOM are normal.  Neck: Neck supple.  Cardiovascular: Normal rate.   Pulmonary/Chest: Breath sounds normal.  Abdominal: He exhibits no distension.  Musculoskeletal:  Left lower extremity reddened at the MTP joint of first great toe with tenderness. No swelling no deformity DP pulse 2+  Nursing note and vitals reviewed.   ED Course  Procedures (including critical care time) Labs Review Labs Reviewed - No data to display  Imaging Review No results found.   EKG Interpretation None      MDM  Plan Prescription indomethacin. [facility]erral [facility]. Blood pressure within recheck 3 weeks Final diagnoses:  None   Diagnoses #1 gouty arthropathy #2 elevated blood pressure     Doug SouSam Deamonte Sayegh, MD 06/21/15 [phone]

## 2015-06-21 NOTE — ED Notes (Signed)
Pt with a history of gout states that he spilled his medications and needs a refill.

## 2015-06-21 NOTE — Discharge Instructions (Signed)
Gout Call [facility] today to get a primary care physician and to arrange to get your blood pressure recheck within the next 3 weeks. Today's was elevated at 147/97. Gout is when your joints become red, sore, and swell (inflamed). This is caused by the buildup of uric acid crystals in the joints. Uric acid is a chemical that is normally in the blood. If the level of uric acid gets too high in the blood, these crystals form in your joints and tissues. Over time, these crystals can form into masses near the joints and tissues. These masses can destroy bone and cause the bone to look misshapen (deformed). HOME CARE   Do not take aspirin for pain.  Only take medicine as told by your doctor.  Rest the joint as much as you can. When in bed, keep sheets and blankets off painful areas.  Keep the sore joints raised (elevated).  Put warm or cold packs on painful joints. Use of warm or cold packs depends on which works best for you.  Use crutches if the painful joint is in your leg.  Drink enough fluids to keep your pee (urine) clear or pale yellow. Limit alcohol, sugary drinks, and drinks with fructose in them.  Follow your diet instructions. Pay careful attention to how much protein you eat. Include fruits, vegetables, whole grains, and fat-free or low-fat milk products in your daily diet. Talk to your doctor or dietitian about the use of coffee, vitamin C, and cherries. These may help lower uric acid levels.  Keep a healthy body weight. GET HELP RIGHT AWAY IF:   You have watery poop (diarrhea), throw up (vomit), or have any side effects from medicines.  You do not feel better in 24 hours, or you are getting worse.  Your joint becomes suddenly more tender, and you have chills or a fever. MAKE SURE YOU:   Understand these instructions.  Will watch your condition.  Will get help right away if you are not doing well or get worse. Document Released: 09/10/2008 Document  Revised: 04/18/2014 Document Reviewed: 07/15/2012 [facility] Patient Information 2015 [employer] [place]. This information is not intended to replace advice given to you by your health care provider. Make sure you discuss any questions you have with your health care provider.

## 2015-06-21 NOTE — ED Notes (Signed)
Pt left with all his belongings and refused wheelchair.  

## 2015-06-27 ENCOUNTER — Ambulatory Visit: Payer: Self-pay | Attending: Physician Assistant | Admitting: Physician Assistant

## 2015-06-27 VITALS — BP 137/90 | HR 95 | Temp 98.2°F | Resp 16 | Wt 234.5 lb

## 2015-06-27 DIAGNOSIS — M1000 Idiopathic gout, unspecified site: Secondary | ICD-10-CM

## 2015-06-27 MED ORDER — INDOMETHACIN 25 MG PO CAPS: 25.0000 mg | ORAL_CAPSULE | Freq: Three times a day (TID) | ORAL | Status: DC | PRN

## 2015-06-27 NOTE — Progress Notes (Addendum)
   Jesus Phillips  RUE:[id number]:[id number]  JYN:[id number]  DOB - Nov 30, 1986  Chief Complaint  Patient presents with  . New patient    Gout   . Gout       Subjective:   This is a 29 year old male here to establish care. He was in the emergency department on July 1 with left foot pain. He was basically at the base of his big toe on the left foot and he was diagnosed with gout and given a prescription for indomethacin. 2-3 days after initiating the medication his daughter spilled his tablets outside and they were nonrecoverable. He presented back to the emergency department on 06/21/2015 to get a refill.  He continues to have some discomfort at the base of the left toe. There is still some swelling. Symptoms have improved greatly however. He is back to work. He is able to bear weight.  ROS:  GEN: denies fever or chills, denies change in weight Skin: denies lesions or rashes EXT: denies muscle spasms or swelling; + pain in lower ext, no weakness   ALLERGIES: No Known Allergies  PAST MEDICAL HISTORY: Past Medical History  Diagnosis Date  . Gout     PAST SURGICAL HISTORY: No past surgical history on file.  MEDICATIONS AT HOME: Prior to Admission medications   Medication Sig Start Date End Date Taking? Authorizing Provider  indomethacin (INDOCIN) 25 MG capsule Take 1 capsule (25 mg total) by mouth 3 (three) times daily as needed. 06/27/15  Yes Luther Newhouse Netta CedarsS Tija Biss, PA-C  oxyCODONE-acetaminophen (PERCOCET) 5-325 MG per tablet Take 1-2 tablets by mouth every 4 (four) hours as needed for moderate pain or severe pain. Patient not taking: Reported on 06/27/2015 01/05/15   Loren Raceravid Yelverton, MD  sulfamethoxazole-trimethoprim (SEPTRA DS) 800-160 MG per tablet Take 1 tablet by mouth every 12 (twelve) hours. Patient not taking: Reported on 01/05/2015 05/11/14   Emilia BeckKaitlyn Szekalski, PA-C   Objective:  Filed Vitals:   06/27/15 1204  BP: 137/90  Pulse: 95  Temp: 98.2 F (36.8 C)  Resp: 16  Weight: 234  lb 8 oz (106.369 kg)  SpO2: 99%    Physical Exam:  General: in no acute distress. HEENT: no pallor, no icterus, moist oral mucosa, no JVD, no lymphadenopathy Extremities: No clubbing cyanosis or edema with positive pedal pulses. Neuro: Alert, awake, oriented x3, nonfocal.  Assessment: 1. Gouty attack 2. Elevated BP 3. Smoker  Plan: Refill Indocin Offered Colchicine but warned of loose stools and pt declined DASH diet counseling Smoking cessation encouraged  Follow up: 2 weeks w/ BP check and baseline labs (avoid diuretics if antihypertensive needed)  Return in about 2 weeks (around 07/11/2015).  The patient was given clear instructions to go to ER or return to medical center if symptoms don't improve, worsen or new problems develop. The patient verbalized understanding. The patient was told to call to get lab results if they haven't heard anything in the next week.   This note has been created with Education officer, environmentalDragon speech recognition software and smart phrase technology. Any transcriptional errors are unintentional.    Scot Juniffany Donnita Farina, PA-C [facility] [place], [place] [phone]   06/27/2015, 12:47 PM

## 2015-06-27 NOTE — Progress Notes (Signed)
New patient here to discuss his Gout. He would like a refill his indocin.

## 2015-07-26 ENCOUNTER — Ambulatory Visit: Payer: Self-pay

## 2016-06-27 ENCOUNTER — Emergency Department (HOSPITAL_COMMUNITY)
Admission: EM | Admit: 2016-06-27 | Discharge: 2016-06-27 | Disposition: A | Discharge status: Home / Self Care | Payer: Self-pay | Attending: Emergency Medicine | Admitting: Emergency Medicine

## 2016-06-27 ENCOUNTER — Encounter (HOSPITAL_COMMUNITY): Payer: Self-pay | Admitting: Emergency Medicine

## 2016-06-27 ENCOUNTER — Emergency Department (HOSPITAL_COMMUNITY): Payer: Self-pay

## 2016-06-27 DIAGNOSIS — M722 Plantar fascial fibromatosis: Secondary | ICD-10-CM | POA: Insufficient documentation

## 2016-06-27 DIAGNOSIS — Z79899 Other long term (current) drug therapy: Secondary | ICD-10-CM | POA: Insufficient documentation

## 2016-06-27 DIAGNOSIS — F17210 Nicotine dependence, cigarettes, uncomplicated: Secondary | ICD-10-CM | POA: Insufficient documentation

## 2016-06-27 MED ORDER — INDOMETHACIN 25 MG PO CAPS: 25.0000 mg | ORAL_CAPSULE | Freq: Three times a day (TID) | ORAL | Status: DC | PRN

## 2016-06-27 NOTE — ED Notes (Signed)
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

## 2016-06-27 NOTE — ED Notes (Signed)
Pt states he understands instructions. Home stable with steady gait. 

## 2016-06-27 NOTE — Discharge Instructions (Signed)
Plantar Fasciitis Plantar fasciitis is a painful foot condition that affects the heel. It occurs when the band of tissue that connects the toes to the heel bone (plantar fascia) becomes irritated. This can happen after exercising too much or doing other repetitive activities (overuse injury). The pain from plantar fasciitis can range from mild irritation to severe pain that makes it difficult for you to walk or move. The pain is usually worse in the morning or after you have been sitting or lying down for a while. CAUSES This condition may be caused by:  Standing for long periods of time.  Wearing shoes that do not fit.  Doing high-impact activities, including running, aerobics, and ballet.  Being overweight.  Having an abnormal way of walking (gait).  Having tight calf muscles.  Having high arches in your feet.  Starting a new athletic activity. SYMPTOMS The main symptom of this condition is heel pain. Other symptoms include:  Pain that gets worse after activity or exercise.  Pain that is worse in the morning or after resting.  Pain that goes away after you walk for a few minutes. DIAGNOSIS This condition may be diagnosed based on your signs and symptoms. Your health care provider will also do a physical exam to check for:  A tender area on the bottom of your foot.  A high arch in your foot.  Pain when you move your foot.  Difficulty moving your foot. You may also need to have imaging studies to confirm the diagnosis. These can include:  X-rays.  Ultrasound.  MRI. TREATMENT  Treatment for plantar fasciitis depends on the severity of the condition. Your treatment may include:  Rest, ice, and over-the-counter pain medicines to manage your pain.  Exercises to stretch your calves and your plantar fascia.  A splint that holds your foot in a stretched, upward position while you sleep (night splint).  Physical therapy to relieve symptoms and prevent problems in the  future.  Cortisone injections to relieve severe pain.  Extracorporeal shock wave therapy (ESWT) to stimulate damaged plantar fascia with electrical impulses. It is often used as a last resort before surgery.  Surgery, if other treatments have not worked after 12 months. HOME CARE INSTRUCTIONS  Take medicines only as directed by your health care provider.  Avoid activities that cause pain.  Roll the bottom of your foot over a bag of ice or a bottle of cold water. Do this for 20 minutes, 3-4 times a day.  Perform simple stretches as directed by your health care provider.  Try wearing athletic shoes with air-sole or gel-sole cushions or soft shoe inserts.  Wear a night splint while sleeping, if directed by your health care provider.  Keep all follow-up appointments with your health care provider. PREVENTION   Do not perform exercises or activities that cause heel pain.  Consider finding low-impact activities if you continue to have problems.  Lose weight if you need to. The best way to prevent plantar fasciitis is to avoid the activities that aggravate your plantar fascia. SEEK MEDICAL CARE IF:  Your symptoms do not go away after treatment with home care measures.  Your pain gets worse.  Your pain affects your ability to move or do your daily activities.   This information is not intended to replace advice given to you by your health care provider. Make sure you discuss any questions you have with your health care provider.   Document Released: 08/27/2001 Document Revised: 08/23/2015 Document Reviewed: 10/12/2014 Elsevier   Interactive Patient Education 2016 Elsevier Inc.  

## 2016-06-27 NOTE — ED Provider Notes (Signed)
CSN: [id number]     Arrival date & time 06/27/16  [id number] History   First MD Initiated Contact with Patient 06/27/16 [phone]     Chief Complaint  Patient presents with  . Foot Pain    LEFT     (Consider location/radiation/quality/duration/timing/severity/associated sxs/prior Treatment) HPI   30 year old male with history of gout presenting for evaluation of left foot pain. Patient report for the past 3 days he has had persistent pain to his arch in which he described as a tightness sensation, worsening in the morning when he walks and increasing pain throughout the day. 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 He has been taking ibuprofen, using Epsom salt bath, and also rolling and ice bottle underneath his arch without adequate improvement. He denies any associated fever, chills, recent injury, knee pain, numbness or weakness, or rash. Denies any change in shoes, or change in activity. He has history of gout which usually manifest in his toes and states that this pain is different. Currently rates pain as 4/10. Report increasing pain with placing his feet. Patient does admits to drinking alcohol on a regular basis.    Past Medical History  Diagnosis Date  . Gout    History reviewed. No pertinent past surgical history. History reviewed. No pertinent family history. Social History  Substance Use Topics  . Smoking status: Current Every Day Smoker    Types: Cigarettes  . Smokeless tobacco: None  . Alcohol Use: Yes    Review of Systems  Constitutional: Negative for fever.  Musculoskeletal: Positive for arthralgias.  Neurological: Negative for numbness.      Allergies  Review of patient's allergies indicates no known allergies.  Home Medications   Prior to Admission medications   Medication Sig Start Date End Date Taking? Authorizing Provider  indomethacin (INDOCIN) 25 MG capsule Take 1 capsule (25 mg total) by mouth 3 (three) times daily as needed. 06/27/15    Tiffany Netta CedarsS Noel, PA-C  oxyCODONE-acetaminophen (PERCOCET) 5-325 MG per tablet Take 1-2 tablets by mouth every 4 (four) hours as needed for moderate pain or severe pain. Patient not taking: Reported on 06/27/2015 01/05/15   Loren Raceravid Yelverton, MD  sulfamethoxazole-trimethoprim (SEPTRA DS) 800-160 MG per tablet Take 1 tablet by mouth every 12 (twelve) hours. Patient not taking: Reported on 01/05/2015 05/11/14   Emilia BeckKaitlyn Szekalski, PA-C   BP 134/91 mmHg  Pulse 90  Temp(Src) 97.9 F (36.6 C) (Oral)  Ht 6\' 4"  (1.93 m)  Wt 113.399 kg  BMI 30.44 kg/m2  SpO2 98% Physical Exam  Constitutional: He appears well-developed and well-nourished. No distress.  HENT:  Head: Atraumatic.  Eyes: Conjunctivae are normal.  Neck: Neck supple.  Musculoskeletal: He exhibits tenderness (Left foot: Tenderness noted to the ball and the sole of foot on palpation without any obvious overlying skin changes, swelling, bruising, or erythema. No gross deformity. Bursitis pedis pulse and posterior tibialis pulses are palpable. Brisk cap refills).  Left knee is nontender. LLE is without palpable cord, erythema or edema. No calf tenderness.  Evidence of pes planus  Neurological: He is alert.  Skin: No rash noted.  Psychiatric: He has a normal mood and affect.  Nursing note and vitals reviewed.   ED Course  Procedures (including critical care time) Labs Review Labs Reviewed - No data to display  Imaging Review Dg Foot Complete Left  06/27/2016  CLINICAL DATA:  No injury, plantar pain starting yesterday EXAM: LEFT FOOT - COMPLETE 3+ VIEW COMPARISON:  None.  FINDINGS: Three views of the left foot submitted. No acute fracture or subluxation. No radiopaque foreign body. IMPRESSION: Negative. Electronically Signed   By: Natasha Mead M.D.   On: 06/27/2016 08:12   I have personally reviewed and evaluated these images and lab results as part of my medical decision-making.   EKG Interpretation None      MDM   Final diagnoses:   Plantar fasciitis of left foot    BP 134/91 mmHg  Pulse 90  Temp(Src) 97.9 F (36.6 C) (Oral)  Ht  (1.93 m)  Wt 113.399 kg  BMI 30.44 kg/m2  SpO2 98%   8:17 AM Patient here with left foot pain. Pain suggestive of angina fasciitis. X-ray of left foot is negative. No evidence of infectious etiology on exam. He is neurovascular intact. No referred pain. Low suspicion for DVT. No suspicion for gout. Rice therapy discussed. Podiatry referral given as needed.  Fayrene Helper, PA-C 06/27/16 1610  Pricilla Loveless, MD 06/28/16 [phone]

## 2017-04-13 ENCOUNTER — Emergency Department (HOSPITAL_COMMUNITY)
Admission: EM | Admit: 2017-04-13 | Discharge: 2017-04-13 | Disposition: A | Discharge status: Home / Self Care | Payer: Self-pay | Attending: Emergency Medicine | Admitting: Emergency Medicine

## 2017-04-13 ENCOUNTER — Encounter (HOSPITAL_COMMUNITY): Payer: Self-pay

## 2017-04-13 DIAGNOSIS — M109 Gout, unspecified: Secondary | ICD-10-CM | POA: Insufficient documentation

## 2017-04-13 DIAGNOSIS — F17210 Nicotine dependence, cigarettes, uncomplicated: Secondary | ICD-10-CM | POA: Insufficient documentation

## 2017-04-13 MED ORDER — COLCHICINE 0.6 MG PO TABS: 0.6000 mg | ORAL_TABLET | Freq: Every day | ORAL | 0 refills | Status: DC

## 2017-04-13 MED ORDER — HYDROCODONE-ACETAMINOPHEN 5-325 MG PO TABS
1.0000 | ORAL_TABLET | Freq: Once | ORAL | Status: AC
  Administered 2017-04-13: 1 via ORAL
  Filled 2017-04-13: qty 1

## 2017-04-13 NOTE — ED Triage Notes (Signed)
Gout in left foot hx of same no help with black cherry pills. sts recently ate pork

## 2017-04-13 NOTE — Discharge Instructions (Signed)

## 2017-04-13 NOTE — ED Provider Notes (Signed)
[facility] Provider Note   CSN: [id number] Arrival date & time: 04/13/17  0041     History   Chief Complaint Chief Complaint  Patient presents with  . Gout    HPI Jesus Phillips is a 31 y.o. male.  HPI   Pt with hx gout p/w gout in his left great toe that began 4 days ago.  Feels exactly like prior gout pain.  Took cherry pills and put ice on it without improvement.  States he has had gout pills before but he doesn't know, has no idea what they are called.  Pt denies fevers, chills, foot injury, leg swelling.    Past Medical History:  Diagnosis Date  . Gout     There are no active problems to display for this patient.   History reviewed. No pertinent surgical history.     Home Medications    Prior to Admission medications   Medication Sig Start Date End Date Taking? Authorizing Provider  colchicine 0.6 MG tablet Take 1 tablet (0.6 mg total) by mouth daily. Take 1.2mg  once, followed by 0.6mg  one hour later if needed.  Max 3 pills daily. 04/13/17   Trixie Dredge, PA-C  indomethacin (INDOCIN) 25 MG capsule Take 1 capsule (25 mg total) by mouth 3 (three) times daily as needed. 06/27/16   Fayrene Helper, PA-C  oxyCODONE-acetaminophen (PERCOCET) 5-325 MG per tablet Take 1-2 tablets by mouth every 4 (four) hours as needed for moderate pain or severe pain. Patient not taking: Reported on 06/27/2015 01/05/15   Loren Racer, MD  sulfamethoxazole-trimethoprim (SEPTRA DS) 800-160 MG per tablet Take 1 tablet by mouth every 12 (twelve) hours. Patient not taking: Reported on 01/05/2015 05/11/14   Emilia Beck, PA-C    Family History History reviewed. No pertinent family history.  Social History Social History  Substance Use Topics  . Smoking status: Current Every Day Smoker    Types: Cigarettes  . Smokeless tobacco: Not on file  . Alcohol use Yes     Allergies   Patient has no known allergies.   Review of Systems Review of Systems  Constitutional: Negative for  chills and fever.  Respiratory: Negative for shortness of breath.   Cardiovascular: Negative for chest pain and leg swelling.  Musculoskeletal: Positive for arthralgias.  Skin: Positive for color change.  Allergic/Immunologic: Negative for immunocompromised state.  Neurological: Negative for weakness and numbness.  Psychiatric/Behavioral: Negative for self-injury.     Physical Exam Updated Vital Signs BP 125/83   Pulse 71   Temp 98.3 F (36.8 C) (Oral)   Resp 16   SpO2 100%   Physical Exam  Constitutional: He appears well-developed and well-nourished. No distress.  HENT:  Head: Normocephalic and atraumatic.  Neck: Neck supple.  Pulmonary/Chest: Effort normal.  Musculoskeletal:  Left 1st MTP with slight tenderness, slight erythema.  Bilateral feet and lower legs otherwise without edema, tenderness, erythema.  No break in skin.  No fluctuance or induration.    Neurological: He is alert.  Skin: He is not diaphoretic.  Nursing note and vitals reviewed.    ED Treatments / Results  Labs (all labs ordered are listed, but only abnormal results are displayed) Labs Reviewed - No data to display  EKG  EKG Interpretation None       Radiology No results found.  Procedures Procedures (including critical care time)  Medications Ordered in ED Medications  HYDROcodone-acetaminophen (NORCO/VICODIN) 5-325 MG per tablet 1 tablet (1 tablet Oral Given 04/13/17 0505)  Initial Impression / Assessment and Plan / ED Course  I have reviewed the triage vital signs and the nursing notes.  Pertinent labs & imaging results that were available during my care of the patient were reviewed by me and considered in my medical decision making (see chart for details).    Afebrile, nontoxic patient with left 1st MTP symptoms of gout, c/w prior gout.  Pt resistant to answering questions and following instructions for exam (e.g.taking shoes off).  Doubt injury, septic joint.  Pt given vicodin  in ED.  D/C home with colchicine, PCP follow up.  Discussed result, findings, treatment, and follow up  with patient.  Pt given return precautions.  Pt verbalizes understanding and agrees with plan.       Final Clinical Impressions(s) / ED Diagnoses   Final diagnoses:  Gout, arthritis    New Prescriptions Discharge Medication List as of 04/13/2017  4:47 AM    START taking these medications   Details  colchicine 0.6 MG tablet Take 1 tablet (0.6 mg total) by mouth daily. Take 1.2mg  once, followed by 0.6mg  one hour later if needed.  Max 3 pills daily., Starting Sun 04/13/2017, Print         [place] Path, PA-C 04/13/17 2349    Zadie Rhine, MD 04/15/17 [phone]

## 2020-02-03 ENCOUNTER — Other Ambulatory Visit: Payer: Self-pay

## 2020-02-03 ENCOUNTER — Encounter: Payer: Self-pay | Admitting: Internal Medicine

## 2020-02-03 ENCOUNTER — Ambulatory Visit: Payer: PRIVATE HEALTH INSURANCE | Attending: Internal Medicine | Admitting: Internal Medicine

## 2020-02-03 VITALS — Ht 75.0 in

## 2020-02-03 DIAGNOSIS — F1010 Alcohol abuse, uncomplicated: Secondary | ICD-10-CM | POA: Insufficient documentation

## 2020-02-03 DIAGNOSIS — Z8739 Personal history of other diseases of the musculoskeletal system and connective tissue: Secondary | ICD-10-CM | POA: Insufficient documentation

## 2020-02-03 DIAGNOSIS — Z114 Encounter for screening for human immunodeficiency virus [HIV]: Secondary | ICD-10-CM

## 2020-02-03 DIAGNOSIS — M722 Plantar fascial fibromatosis: Secondary | ICD-10-CM

## 2020-02-03 MED ORDER — COLCHICINE 0.6 MG PO TABS: 0.6000 mg | ORAL_TABLET | Freq: Every day | ORAL | 2 refills | Status: DC

## 2020-02-03 NOTE — Patient Instructions (Signed)
 Gout  Gout is painful swelling of your joints. Gout is a type of arthritis. It is caused by having too much uric acid in your body. Uric acid is a chemical that is made when your body breaks down substances called purines. If your body has too much uric acid, sharp crystals can form and build up in your joints. This causes pain and swelling. Gout attacks can happen quickly and be very painful (acute gout). Over time, the attacks can affect more joints and happen more often (chronic gout). What are the causes?  Too much uric acid in your blood. This can happen because: ? Your kidneys do not remove enough uric acid from your blood. ? Your body makes too much uric acid. ? You eat too many foods that are high in purines. These foods include organ meats, some seafood, and beer.  Trauma or stress. What increases the risk?  Having a family history of gout.  Being male and middle-aged.  Being male and having gone through menopause.  Being very overweight (obese).  Drinking alcohol, especially beer.  Not having enough water in the body (being dehydrated).  Losing weight too quickly.  Having an organ transplant.  Having lead poisoning.  Taking certain medicines.  Having kidney disease.  Having a skin condition called psoriasis. What are the signs or symptoms? An attack of acute gout usually happens in just one joint. The most common place is the big toe. Attacks often start at night. Other joints that may be affected include joints of the feet, ankle, knee, fingers, wrist, or elbow. Symptoms of an attack may include:  Very bad pain.  Warmth.  Swelling.  Stiffness.  Shiny, red, or purple skin.  Tenderness. The affected joint may be very painful to touch.  Chills and fever. Chronic gout may cause symptoms more often. More joints may be involved. You may also have white or yellow lumps (tophi) on your hands or feet or in other areas near your joints. How is this  treated?  Treatment for this condition has two phases: treating an acute attack and preventing future attacks.  Acute gout treatment may include: ? NSAIDs. ? Steroids. These are taken by mouth or injected into a joint. ? Colchicine. This medicine relieves pain and swelling. It can be given by mouth or through an IV tube.  Preventive treatment may include: ? Taking small doses of NSAIDs or colchicine daily. ? Using a medicine that reduces uric acid levels in your blood. ? Making changes to your diet. You may need to see a food expert (dietitian) about what to eat and drink to prevent gout. Follow these instructions at home: During a gout attack   If told, put ice on the painful area: ? Put ice in a plastic bag. ? Place a towel between your skin and the bag. ? Leave the ice on for 20 minutes, 2-3 times a day.  Raise (elevate) the painful joint above the level of your heart as often as you can.  Rest the joint as much as possible. If the joint is in your leg, you may be given crutches.  Follow instructions from your doctor about what you cannot eat or drink. Avoiding future gout attacks  Eat a low-purine diet. Avoid foods and drinks such as: ? Liver. ? Kidney. ? Anchovies. ? Asparagus. ? Herring. ? Mushrooms. ? Mussels. ? Beer.  Stay at a healthy weight. If you want to lose weight, talk with your doctor. Do not lose   weight too fast.  Start or continue an exercise plan as told by your doctor. Eating and drinking  Drink enough fluids to keep your pee (urine) pale yellow.  If you drink alcohol: ? Limit how much you use to:  0-1 drink a day for women.  0-2 drinks a day for men. ? Be aware of how much alcohol is in your drink. In the [place] one drink equals one 12 oz bottle of beer (355 mL), one 5 oz glass of wine (148 mL), or one 1 oz glass of hard liquor (44 mL). General instructions  Take over-the-counter and prescription medicines only as told by your doctor.  Do  not drive or use heavy machinery while taking prescription pain medicine.  Return to your normal activities as told by your doctor. Ask your doctor what activities are safe for you.  Keep all follow-up visits as told by your doctor. This is important. Contact a doctor if:  You have another gout attack.  You still have symptoms of a gout attack after 10 days of treatment.  You have problems (side effects) because of your medicines.  You have chills or a fever.  You have burning pain when you pee (urinate).  You have pain in your lower back or belly. Get help right away if:  You have very bad pain.  Your pain cannot be controlled.  You cannot pee. Summary  Gout is painful swelling of the joints.  The most common site of pain is the big toe, but it can affect other joints.  Medicines and avoiding some foods can help to prevent and treat gout attacks. This information is not intended to replace advice given to you by your health care provider. Make sure you discuss any questions you have with your health care provider. Document Revised: 06/24/2018 Document Reviewed: 06/24/2018 Elsevier Patient Education  2020 [employer].  

## 2020-02-03 NOTE — Progress Notes (Signed)
Pt states he is pain is coming from his right foot   Pt states his arch dropped  Pt states he seen the foot doctor the other day   Pt states he has been taking tylenol for the pain  Pt states when he goes to bed his foot throbs

## 2020-02-03 NOTE — Progress Notes (Signed)
Virtual Visit via Telephone Note Due to current restrictions/limitations of in-office visits due to the COVID-19 pandemic, this scheduled clinical appointment was converted to a telehealth visit  I connected with Jesus Phillips on 02/03/20 at 4:18 p.m by telephone and verified that I am speaking with the correct person using two identifiers. I am in my office.  The patient is at home.  Only the patient and myself participated in this encounter.  I discussed the limitations, risks, security and privacy concerns of performing an evaluation and management service by telephone and the availability of in person appointments. I also discussed with the patient that there may be a patient responsible charge related to this service. The patient expressed understanding and agreed to proceed.   History of Present Illness: Patient with history of probable gout, tobacco dependence Last seen at our clinic 06/2015.  Patient wanting to reestablish care. Just got health insurance and wanted to be more proactive about his health  Gives hx of gout "that pop up from time to time." -estimate that he has had 4-5 episodes in past 6 mths.  Usually seen at a [facility] clinic -jts involve include LT knee and big toes -Given the medication from urgent care which he takes whenever he feels an episode coming on and it works well.  He does not recall the name.  He thinks it may be colchicine.  Saw podiatrist 2 wks ago.  Dx with plantar faciitis RT foot and dropped arch.  Given steroid inj to RT foot and samples of Relafen.  Does not feel the inj and Relafen helped much.  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  He tells me that he has cut back on his drinking.  He would like to have some baseline labs checked.   Current Outpatient Medications on File Prior to Visit  Medication Sig Dispense  Refill  . colchicine 0.6 MG tablet Take 1 tablet (0.6 mg total) by mouth daily. Take 1.2mg  once, followed by 0.6mg  one hour later if needed.  Max 3 pills daily. (Patient not taking: Reported on 02/03/2020) 10 tablet 0  . indomethacin (INDOCIN) 25 MG capsule Take 1 capsule (25 mg total) by mouth 3 (three) times daily as needed. (Patient not taking: Reported on 02/03/2020) 30 capsule 0  . oxyCODONE-acetaminophen (PERCOCET) 5-325 MG per tablet Take 1-2 tablets by mouth every 4 (four) hours as needed for moderate pain or severe pain. (Patient not taking: Reported on 06/27/2015) 20 tablet 0  . sulfamethoxazole-trimethoprim (SEPTRA DS) 800-160 MG per tablet Take 1 tablet by mouth every 12 (twelve) hours. (Patient not taking: Reported on 01/05/2015) 10 tablet 0   No current facility-administered medications on file prior to visit.    Observations/Objective: No direct observation done as this was a telephone encounter.  Assessment and Plan: 1. History of gout -Discussed management.  Given that he has frequent episodes, we will put him on colchicine to take daily and check a uric acid level.  If uric acid level is elevated we will add allopurinol or Uloric. Advised patient to avoid certain things like too much red meat, organ meat, excessive alcohol, shrimp as this can cause frequent flares. - Comprehensive metabolic panel; Future - CBC; Future - Lipid panel; Future - Uric Acid; Future  2. Plantar fasciitis Followed and being managed by podiatry  3.  Alcohol use disorder, mild, abuse Advised patient that he is drinking above and beyond the recommended daily amount for men which is no more than 2 standard drinks a day.  Advised him to cut back on the shots.  He feels he can do so for his health. - Comprehensive metabolic panel; Future  4. Screening for HIV (human immunodeficiency virus) Discussed screening.  Patient willing to have it done. - HIV Antibody (routine testing w rflx); Future   Follow  Up Instructions: Physical in 2 mths   I discussed the assessment and treatment plan with the patient. The patient was provided an opportunity to ask questions and all were answered. The patient agreed with the plan and demonstrated an understanding of the instructions.   The patient was advised to call back or seek an in-person evaluation if the symptoms worsen or if the condition fails to improve as anticipated.  I provided 18 minutes of non-face-to-face time during this encounter.   Jonah Blue, MD

## 2020-02-04 ENCOUNTER — Other Ambulatory Visit: Payer: Self-pay

## 2020-02-07 ENCOUNTER — Ambulatory Visit: Payer: PRIVATE HEALTH INSURANCE | Attending: Internal Medicine

## 2020-02-07 ENCOUNTER — Other Ambulatory Visit: Payer: Self-pay

## 2020-02-07 ENCOUNTER — Telehealth: Payer: Self-pay | Admitting: Internal Medicine

## 2020-02-07 DIAGNOSIS — Z8739 Personal history of other diseases of the musculoskeletal system and connective tissue: Secondary | ICD-10-CM

## 2020-02-07 DIAGNOSIS — F1010 Alcohol abuse, uncomplicated: Secondary | ICD-10-CM

## 2020-02-07 DIAGNOSIS — Z114 Encounter for screening for human immunodeficiency virus [HIV]: Secondary | ICD-10-CM

## 2020-02-07 MED ORDER — COLCHICINE 0.6 MG PO TABS: 0.6000 mg | ORAL_TABLET | Freq: Every day | ORAL | 2 refills | Status: DC

## 2020-02-07 NOTE — Telephone Encounter (Signed)
Rx resent to pharmacy

## 2020-02-07 NOTE — Telephone Encounter (Signed)
1) Medication(s) Requested (by name): -colchicine 0.6 MG tablet   2) Pharmacy of Choice: -[id number] - [place], [place] - [address]   3) Special Requests: Medication was printed and not sent normal. Please send to pharmacy as patient is not able to pickup script

## 2020-02-08 ENCOUNTER — Other Ambulatory Visit: Payer: Self-pay | Admitting: Internal Medicine

## 2020-02-08 LAB — LIPID PANEL
Chol/HDL Ratio: 3.5 ratio (ref 0.0–5.0)
Cholesterol, Total: 175 mg/dL (ref 100–199)
HDL: 50 mg/dL (ref >39)
LDL Chol Calc (NIH): 97 mg/dL (ref 0–99)
Triglycerides: 164 mg/dL — ABNORMAL HIGH (ref 0–149)
VLDL Cholesterol Cal: 28 mg/dL (ref 5–40)

## 2020-02-08 LAB — CBC
Hematocrit: 41.3 % (ref 37.5–51.0)
Hemoglobin: 14.1 g/dL (ref 13.0–17.7)
MCH: 30.1 pg (ref 26.6–33.0)
MCHC: 34.1 g/dL (ref 31.5–35.7)
MCV: 88 fL (ref 79–97)
Platelets: 475 10*3/uL — ABNORMAL HIGH (ref 150–450)
RBC: 4.69 x10E6/uL (ref 4.14–5.80)
RDW: 11.8 % (ref 11.6–15.4)
WBC: 8 10*3/uL (ref 3.4–10.8)

## 2020-02-08 LAB — URIC ACID: Uric Acid: 11.2 mg/dL — ABNORMAL HIGH (ref 3.8–8.4)

## 2020-02-08 LAB — COMPREHENSIVE METABOLIC PANEL
ALT: 27 IU/L (ref 0–44)
AST: 23 IU/L (ref 0–40)
Albumin/Globulin Ratio: 1.7 (ref 1.2–2.2)
Albumin: 4.8 g/dL (ref 4.0–5.0)
Alkaline Phosphatase: 147 IU/L — ABNORMAL HIGH (ref 39–117)
BUN/Creatinine Ratio: 13 (ref 9–20)
BUN: 14 mg/dL (ref 6–20)
Bilirubin Total: 0.4 mg/dL (ref 0.0–1.2)
CO2: 20 mmol/L (ref 20–29)
Calcium: 10 mg/dL (ref 8.7–10.2)
Chloride: 103 mmol/L (ref 96–106)
Creatinine, Ser: 1.08 mg/dL (ref 0.76–1.27)
GFR calc Af Amer: 104 mL/min/{1.73_m2} (ref >59)
GFR calc non Af Amer: 90 mL/min/{1.73_m2} (ref >59)
Globulin, Total: 2.9 g/dL (ref 1.5–4.5)
Glucose: 102 mg/dL — ABNORMAL HIGH (ref 65–99)
Potassium: 4.8 mmol/L (ref 3.5–5.2)
Sodium: 142 mmol/L (ref 134–144)
Total Protein: 7.7 g/dL (ref 6.0–8.5)

## 2020-02-08 LAB — HIV ANTIBODY (ROUTINE TESTING W REFLEX): HIV Screen 4th Generation wRfx: NONREACTIVE

## 2020-02-08 MED ORDER — ALLOPURINOL 100 MG PO TABS: 200.0000 mg | ORAL_TABLET | Freq: Every day | ORAL | 2 refills | Status: DC

## 2020-02-10 ENCOUNTER — Telehealth: Payer: Self-pay

## 2020-02-10 NOTE — Telephone Encounter (Signed)
Contacted pt to go over lab results pt is aware of results and doesn't have any questions or concerns 

## 2020-04-07 ENCOUNTER — Encounter: Payer: Self-pay | Admitting: Internal Medicine

## 2020-04-14 ENCOUNTER — Other Ambulatory Visit: Payer: Self-pay | Admitting: Internal Medicine

## 2020-05-31 ENCOUNTER — Other Ambulatory Visit: Payer: Self-pay

## 2020-05-31 ENCOUNTER — Ambulatory Visit (INDEPENDENT_AMBULATORY_CARE_PROVIDER_SITE_OTHER): Payer: Self-pay | Admitting: Podiatrist

## 2020-05-31 ENCOUNTER — Encounter: Payer: Self-pay | Admitting: Podiatrist

## 2020-05-31 ENCOUNTER — Ambulatory Visit (INDEPENDENT_AMBULATORY_CARE_PROVIDER_SITE_OTHER): Payer: Self-pay

## 2020-05-31 DIAGNOSIS — M2141 Flat foot [pes planus] (acquired), right foot: Secondary | ICD-10-CM

## 2020-05-31 DIAGNOSIS — Z8739 Personal history of other diseases of the musculoskeletal system and connective tissue: Secondary | ICD-10-CM

## 2020-05-31 DIAGNOSIS — M2142 Flat foot [pes planus] (acquired), left foot: Secondary | ICD-10-CM

## 2020-05-31 DIAGNOSIS — M76829 Posterior tibial tendinitis, unspecified leg: Secondary | ICD-10-CM

## 2020-05-31 MED ORDER — MELOXICAM 15 MG PO TABS: 15.0000 mg | ORAL_TABLET | Freq: Every day | ORAL | 0 refills | Status: DC

## 2020-05-31 MED ORDER — METHYLPREDNISOLONE 4 MG PO TBPK: ORAL_TABLET | ORAL | 1 refills | Status: DC

## 2020-05-31 NOTE — Progress Notes (Signed)
    Chief Complaint  Patient presents with  . Foot Pain    R dorsal midfoot (lateral aspect) and L plantar midfoot. Pt stated, "I've had problems with my feet for years, but the L ft pain is more recent. I'm here for a 2nd opinion. I've been diagnosed with gout flare-ups (bilateral), plantar fasciitis, and 1 fallen arch (right?). I push through the pain, but it's been 10/10. I bought inserts from [facility] and orthotics from my doctor at [facility]. They don't seem to help".      HPI: Patient is 34 y.o. male who presents today for the concerns as listed above.  Patient has had foot problems since he was a child and they continue today. He has been diagnosed with gout and plantar fasciitis and has obtained arch supports from the [facility] and he also received orthotics from a podiatrist at [facility] of which he states he has had minimal improvement in pain.  He is here today to see if there are other options for his foot discomfort.     Review of Systems No fevers, chills, nausea, muscle aches, no difficulty breathing, no calf pain, no chest pain or shortness of breath.   Physical Exam  GENERAL APPEARANCE: Alert, conversant. Appropriately groomed. No acute distress.   VASCULAR: Pedal pulses palpable DP and PT bilateral.  Capillary refill time is immediate to all digits,  Proximal to distal cooling it warm to warm.  Digital perfusion adequate.   NEUROLOGIC: sensation is intact epicritically and protectively to 5.07 monofilament at 5/5 sites bilateral.  Light touch is intact bilateral, vibratory sensation intact bilateral, achilles tendon reflex is intact bilateral.   MUSCULOSKELETAL: acceptable muscle strength, tone and stability bilateral.  Flat foot type/ pes planus deformity noted bilateral with pain along the planter midfoot and medial arch of the left foot.  And dorsal medial and dorsal lateral aspect of the right foot.  Pain along the posterior tibial tendon bilateral.  Unable to stand without discomfort, also Unable to perform a double or single heel raise.    DERMATOLOGIC: skin is warm, supple, and dry.  No open lesions noted.  No rash, no pre ulcerative lesions. Digital nails are asymptomatic.    xraysBilateral show a pes planus deformity with decrease in calcaneal inclination angle bilateral, navicular appears to have arthritic changes noted, first ray is elevated, no acute sign of fracture or dislocation noted.     Assessment     ICD-10-CM   1. History of gout  Z87.39 DG Foot Complete Right    DG Foot Complete Left  2. Posterior tibial tendon dysfunction  M76.829   3. Pes planus of both feet  M21.41    M21.42      Plan  Recommended a short course of a steroid taper followed by meloxicam to try and help with the generalized bilateral foot discomfort he is experiencing at todays visit.  He will be seen back in 6 weeks with Dr. Ardelle Anton to further evaluate and provide treatment options should they be needed at that time.

## 2020-05-31 NOTE — Patient Instructions (Signed)
I am sending a 2 prescriptions-  1 is for a steroid-  Take as directed (taper down medicine)  When you are finished with this medication, then start taking the second medicine daily (with food) for 3 weeks.  (mobic)

## 2020-06-18 ENCOUNTER — Encounter (HOSPITAL_COMMUNITY): Payer: Self-pay

## 2020-06-18 ENCOUNTER — Ambulatory Visit (HOSPITAL_COMMUNITY)
Admission: EM | Admit: 2020-06-18 | Discharge: 2020-06-18 | Disposition: A | Discharge status: Home / Self Care | Payer: PRIVATE HEALTH INSURANCE | Attending: Urgent Care | Admitting: Urgent Care

## 2020-06-18 ENCOUNTER — Other Ambulatory Visit: Payer: Self-pay

## 2020-06-18 DIAGNOSIS — M109 Gout, unspecified: Secondary | ICD-10-CM

## 2020-06-18 DIAGNOSIS — M79672 Pain in left foot: Secondary | ICD-10-CM | POA: Diagnosis not present

## 2020-06-18 DIAGNOSIS — M7989 Other specified soft tissue disorders: Secondary | ICD-10-CM

## 2020-06-18 MED ORDER — METHYLPREDNISOLONE ACETATE 80 MG/ML IJ SUSP
INTRAMUSCULAR | Status: AC
  Filled 2020-06-18: qty 1

## 2020-06-18 MED ORDER — METHYLPREDNISOLONE ACETATE 80 MG/ML IJ SUSP
80.0000 mg | Freq: Once | INTRAMUSCULAR | Status: AC
  Administered 2020-06-18: 80 mg via INTRAMUSCULAR

## 2020-06-18 NOTE — Discharge Instructions (Addendum)
During a gout attack, you need to stop taking allopurinol. For the next week do not take colchicine either since we are giving you an injection of methylprednisolone. In about 5 days, you can restart colchicine. Wait to restart allopurinol until your gout flare has fully resolved.    For diabetes or elevated blood sugar, please make sure you are avoiding starchy, carbohydrate foods like pasta, breads, pastry, rice, potatoes, desserts. These foods can elevated your blood sugar. Also, avoid sodas, sweet teas, sugary beverages, fruit juices.  Drinking plain water will be much more helpful, try 64 ounces of water daily.  It is okay to flavor your water naturally by cutting cucumber, lemon, mint or lime, placing it in a picture with water and drinking it over a period of 2 to 3 days as long as it remains refrigerated.    For elevated blood pressure, make sure you are monitoring salt in your diet.  Do not eat restaurant foods and limit processed foods at home, prepare/cook your own foods at home.  Processed foods include things like frozen meals preseasoned meats and dinners, deli meats, canned foods as they are high in sodium/salt.  Make sure your pain attention to sodium labels on foods you by at the grocery store.  For seasoning you can use a brand called Mrs. Dash which includes a lot of salt free seasonings.  Salads - kale, spinach, cabbage, spring mix; use seeds like pumpkin seeds or sunflower seeds, almonds, walnuts or pecans; you can also use 1-2 hard boiled eggs in your salads Fruits - avocadoes, berries (blueberries, raspberries, blackberries), apples, oranges, pomegranate, pear; avoid eating bananas, grapes regularly Vegetables - cauliflower, broccoli, green beans, brussel spouts, bell peppers; stay away from starchy vegetables like potatoes, carrots, peas  Limit cheese, alcohol consumption for gout. Regarding meat, limit your meats including fish, shell fish. Chicken breast is a good option as they  tend to be leaner sources of good protein.  The best way to eat this is grilled or baked. Sauteed chicken is ok as long as you limit oils to cook with and preferably using olive oil.   DO NOT EAT ANY FOODS ON THIS LIST THAT YOU ARE ALLERGIC TO.

## 2020-06-18 NOTE — ED Provider Notes (Signed)
[facility]   MRN: [id number] DOB: [birth year]  Subjective:   Jesus Phillips is a 34 y.o. male presenting for acute on chronic left foot pain and swelling over the past 2 days.  Patient is previously told that he has plantar fasciitis by a podiatrist but is currently being managed by his PCP for gout.  States that he has cut back his drinking but admits that he is not eating a healthy diet.  He is using allopurinol and colchicine daily.  Denies falls, trauma.  No current facility-administered medications for this encounter.  Current Outpatient Medications:  .  allopurinol (ZYLOPRIM) 100 MG tablet, TAKE 2 TABLETS BY MOUTH EVERY DAY, Disp: 60 tablet, Rfl: 2 .  colchicine 0.6 MG tablet, TAKE 1 TABLET BY MOUTH EVERY DAY, Disp: 30 tablet, Rfl: 2 .  meloxicam (MOBIC) 15 MG tablet, Take 1 tablet (15 mg total) by mouth daily. Start taking after the completion of the oral steroid medication, Disp: 30 tablet, Rfl: 0 .  methylPREDNISolone (MEDROL DOSEPAK) 4 MG TBPK tablet, Take taper as directed in package instructions, Disp: 21 tablet, Rfl: 1   No Known Allergies  Past Medical History:  Diagnosis Date  . Gout      History reviewed. No pertinent surgical history.  History reviewed. No pertinent family history.  Social History   Tobacco Use  . Smoking status: Current Every Day Smoker    Types: Cigarettes  . Smokeless tobacco: Never Used  Vaping Use  . Vaping Use: Never used  Substance Use Topics  . Alcohol use: Yes  . Drug use: Yes    Types: Marijuana    ROS   Objective:   Vitals: BP (!) 144/97 (BP Location: Right Arm)   Pulse 83   Temp 98.7 F (37.1 C) (Oral)   Resp 18   SpO2 100%   Physical Exam Constitutional:      General: He is not in acute distress.    Appearance: Normal appearance. He is well-developed and normal weight. He is not ill-appearing, toxic-appearing or diaphoretic.  HENT:     Head: Normocephalic and atraumatic.     Right Ear: External ear  normal.     Left Ear: External ear normal.     Nose: Nose normal.     Mouth/Throat:     Pharynx: Oropharynx is clear.  Eyes:     General: No scleral icterus.       Right eye: No discharge.        Left eye: No discharge.     Extraocular Movements: Extraocular movements intact.     Pupils: Pupils are equal, round, and reactive to light.  Cardiovascular:     Rate and Rhythm: Normal rate.  Pulmonary:     Effort: Pulmonary effort is normal.  Musculoskeletal:     Cervical back: Normal range of motion.     Left foot: Decreased range of motion. Swelling and tenderness present.       Feet:  Neurological:     Mental Status: He is alert and oriented to person, place, and time.  Psychiatric:        Mood and Affect: Mood normal.        Behavior: Behavior normal.        Thought Content: Thought content normal.        Judgment: Judgment normal.      Assessment and Plan :   PDMP not reviewed this encounter.  1. Left foot pain   2. Swelling of left  foot   3. Acute gout of left foot, unspecified cause     Patient has no physical exam findings supporting plantar fasciitis.  Patient given IM Depo-Medrol as he refused oral prednisone course to address his gout flare.  Admitted medical noncompliance and therefore I am in agreement.  Recommended holding allopurinol and colchicine.  He can restart colchicine in about 5 days.  Follow-up with PCP ASAP. Counseled patient on potential for adverse effects with medications prescribed/recommended today, ER and return-to-clinic precautions discussed, patient verbalized understanding.    Wallis Bamberg, [place] 06/18/20 1338

## 2020-06-18 NOTE — ED Triage Notes (Addendum)
Pt presents with pain and swelling in the left foot x 2 days. Pt can not put weight on. States he has gout and plantar fascitis.   Pt not taking his medications for gout as prescribed by the PCP, "I do not like to take prescriptions"

## 2020-07-11 ENCOUNTER — Ambulatory Visit: Payer: Self-pay | Admitting: Podiatry

## 2020-07-28 ENCOUNTER — Other Ambulatory Visit: Payer: Self-pay | Admitting: Internal Medicine

## 2020-08-22 ENCOUNTER — Other Ambulatory Visit: Payer: Self-pay | Admitting: Internal Medicine

## 2020-11-14 ENCOUNTER — Other Ambulatory Visit: Payer: Self-pay | Admitting: Internal Medicine

## 2020-12-12 ENCOUNTER — Other Ambulatory Visit: Payer: Self-pay | Admitting: Internal Medicine

## 2021-02-10 ENCOUNTER — Other Ambulatory Visit: Payer: Self-pay | Admitting: Internal Medicine

## 2021-02-10 NOTE — Telephone Encounter (Signed)
Requested medication (s) are due for refill today: yes  Requested medication (s) are on the active medication list: yes  Last refill:  11/14/20  Future visit scheduled: no  Notes to clinic:  overdue lab work   Requested Prescriptions  Pending Prescriptions Disp Refills   allopurinol (ZYLOPRIM) 100 MG tablet [Pharmacy Med Name: ALLOPURINOL 100 MG TABLET] 180 tablet 0    Sig: TAKE 2 TABLETS BY MOUTH EVERY DAY      Endocrinology:  Gout Agents Failed - 02/10/2021  2:33 PM      Failed - Uric Acid in normal range and within 360 days    Uric Acid  Date Value Ref Range Status  02/07/2020 11.2 (H) 3.8 - 8.4 mg/dL Final    Comment:               Therapeutic target for gout patients: <6.0          Failed - Cr in normal range and within 360 days    Creatinine, Ser  Date Value Ref Range Status  02/07/2020 1.08 0.76 - 1.27 mg/dL Final          Failed - Valid encounter within last 12 months    Recent Outpatient Visits           1 year ago History of gout   [facility] Marcine Matar, MD   5 years ago Acute idiopathic gout, unspecified site   [facility] [place], [place], [place]

## 2021-02-14 ENCOUNTER — Other Ambulatory Visit: Payer: Self-pay | Admitting: Internal Medicine

## 2021-02-24 ENCOUNTER — Other Ambulatory Visit: Payer: Self-pay | Admitting: Internal Medicine

## 2021-02-24 NOTE — Telephone Encounter (Signed)
Requested medication (s) are due for refill today: yes  Requested medication (s) are on the active medication list: yes  Last refill:  12/12/20  Future visit scheduled: no  Notes to clinic:  overdue lab work   Requested Prescriptions  Pending Prescriptions Disp Refills   colchicine 0.6 MG tablet [Pharmacy Med Name: COLCHICINE 0.6 MG TABLET] 30 tablet 2    Sig: TAKE 1 TABLET BY MOUTH EVERY DAY      Endocrinology:  Gout Agents Failed - 02/24/2021 12:33 PM      Failed - Uric Acid in normal range and within 360 days    Uric Acid  Date Value Ref Range Status  02/07/2020 11.2 (H) 3.8 - 8.4 mg/dL Final    Comment:               Therapeutic target for gout patients: <6.0          Failed - Cr in normal range and within 360 days    Creatinine, Ser  Date Value Ref Range Status  02/07/2020 1.08 0.76 - 1.27 mg/dL Final          Failed - Valid encounter within last 12 months    Recent Outpatient Visits           1 year ago History of gout   [facility] Marcine Matar, MD   5 years ago Acute idiopathic gout, unspecified site   [facility] [place], [place], [place]

## 2021-02-28 ENCOUNTER — Other Ambulatory Visit: Payer: Self-pay | Admitting: Internal Medicine

## 2021-02-28 MED ORDER — COLCHICINE 0.6 MG PO TABS: 0.6000 mg | ORAL_TABLET | Freq: Every day | ORAL | 0 refills | Status: DC

## 2021-02-28 NOTE — Telephone Encounter (Signed)
Refill request for colchicine; no valid encounter within last 12 months; no upcoming visits noted; pt notified; decision tree completed; pt offered and accepted virtual appt with Georgian Co, [facility], 03/01/21 at 1430; pt informed that courtesy refill granted and visit needed for additional refills; he verbalized understanding; will route to office for notification of encounter. Requested Prescriptions  Pending Prescriptions Disp Refills  . colchicine 0.6 MG tablet 30 tablet 0    Sig: Take 1 tablet (0.6 mg total) by mouth daily.     Endocrinology:  Gout Agents Failed - 02/28/2021  9:13 AM      Failed - Uric Acid in normal range and within 360 days    Uric Acid  Date Value Ref Range Status  02/07/2020 11.2 (H) 3.8 - 8.4 mg/dL Final    Comment:               Therapeutic target for gout patients: <6.0         Failed - Cr in normal range and within 360 days    Creatinine, Ser  Date Value Ref Range Status  02/07/2020 1.08 0.76 - 1.27 mg/dL Final         Failed - Valid encounter within last 12 months    Recent Outpatient Visits          1 year ago History of gout   [facility] Marcine Matar, MD   5 years ago Acute idiopathic gout, unspecified site   [facility] [place], [place], [place]

## 2021-02-28 NOTE — Telephone Encounter (Signed)
Copied from CRM [id number]. Topic: Quick Communication - Rx Refill/Question >> Feb 28, 2021  9:07 AM Gaetana Michaelis A wrote: Medication: allopurinol (ZYLOPRIM) 100 MG tablet  colchicine 0.6 MG tablet   Has the patient contacted their pharmacy? Yes. Pharmacy has contacted patient and directed them to contact PCP  Preferred Pharmacy (with phone number or street name): [id number] - [place], [place] - [address]  Phone:  [phone]  Agent: Please be advised that RX refills may take up to 3 business days. We ask that you follow-up with your pharmacy.

## 2021-03-01 ENCOUNTER — Other Ambulatory Visit: Payer: Self-pay

## 2021-03-01 ENCOUNTER — Ambulatory Visit: Payer: Self-pay | Attending: Physician Assistant | Admitting: Physician Assistant

## 2021-03-01 DIAGNOSIS — Z13220 Encounter for screening for lipoid disorders: Secondary | ICD-10-CM

## 2021-03-01 DIAGNOSIS — Z8739 Personal history of other diseases of the musculoskeletal system and connective tissue: Secondary | ICD-10-CM

## 2021-03-01 MED ORDER — COLCHICINE 0.6 MG PO TABS: 0.6000 mg | ORAL_TABLET | Freq: Every day | ORAL | 5 refills | Status: DC

## 2021-03-01 MED ORDER — ALLOPURINOL 100 MG PO TABS
200.0000 mg | ORAL_TABLET | Freq: Every day | ORAL | 1 refills | Status: DC
Start: 2021-03-01 — End: 2021-06-28

## 2021-03-01 NOTE — Progress Notes (Signed)
Needs refills on medications. 

## 2021-03-01 NOTE — Progress Notes (Signed)
Patient ID: Jesus Phillips, male   DOB: 06-22-1986, 35 y.o.   MRN: [id number] Virtual Visit via Telephone Note  I connected with Brittney Judie Grieve on 03/01/21 at  2:30 PM EDT by telephone and verified that I am speaking with the correct person using two identifiers.  Location: Patient: Home Provider: [facility] office   I discussed the limitations, risks, security and privacy concerns of performing an evaluation and management service by telephone and the availability of in person appointments. I also discussed with the patient that there may be a patient responsible charge related to this service. The patient expressed understanding and agreed to proceed.   History of Present Illness:patient needs RF on gout meds.  No active flare.  No current pain.  No new issues or concerns.  He is willing to come and get labs.    Observations/Objective:  NAD.  A&Ox3   Assessment and Plan: 1. History of gout - colchicine 0.6 MG tablet; Take 1 tablet (0.6 mg total) by mouth daily.  Dispense: 30 tablet; Refill: 5 - allopurinol (ZYLOPRIM) 100 MG tablet; Take 2 tablets (200 mg total) by mouth daily.  Dispense: 180 tablet; Refill: 1 - Comprehensive metabolic panel; Future - Uric Acid; Future  2. Screening cholesterol level - Lipid panel; Future    Follow Up Instructions: See PCP in 6 months   I discussed the assessment and treatment plan with the patient. The patient was provided an opportunity to ask questions and all were answered. The patient agreed with the plan and demonstrated an understanding of the instructions.   The patient was advised to call back or seek an in-person evaluation if the symptoms worsen or if the condition fails to improve as anticipated.  I provided 9 minutes of non-face-to-face time during this encounter.   Georgian Co, PA-C

## 2021-06-15 ENCOUNTER — Ambulatory Visit: Payer: PRIVATE HEALTH INSURANCE | Admitting: Internal Medicine

## 2021-06-28 ENCOUNTER — Encounter (INDEPENDENT_AMBULATORY_CARE_PROVIDER_SITE_OTHER): Payer: Self-pay

## 2021-06-28 ENCOUNTER — Ambulatory Visit: Payer: PRIVATE HEALTH INSURANCE | Attending: Internal Medicine | Admitting: Internal Medicine

## 2021-06-28 ENCOUNTER — Other Ambulatory Visit: Payer: Self-pay

## 2021-06-28 ENCOUNTER — Encounter: Payer: Self-pay | Admitting: Internal Medicine

## 2021-06-28 VITALS — BP 144/100 | HR 112 | Resp 16 | Ht 75.0 in | Wt 225.2 lb

## 2021-06-28 DIAGNOSIS — F1010 Alcohol abuse, uncomplicated: Secondary | ICD-10-CM

## 2021-06-28 DIAGNOSIS — Z8739 Personal history of other diseases of the musculoskeletal system and connective tissue: Secondary | ICD-10-CM

## 2021-06-28 DIAGNOSIS — Z1159 Encounter for screening for other viral diseases: Secondary | ICD-10-CM

## 2021-06-28 DIAGNOSIS — E663 Overweight: Secondary | ICD-10-CM | POA: Insufficient documentation

## 2021-06-28 DIAGNOSIS — I10 Essential (primary) hypertension: Secondary | ICD-10-CM | POA: Insufficient documentation

## 2021-06-28 DIAGNOSIS — Z0000 Encounter for general adult medical examination without abnormal findings: Secondary | ICD-10-CM | POA: Diagnosis not present

## 2021-06-28 DIAGNOSIS — F17210 Nicotine dependence, cigarettes, uncomplicated: Secondary | ICD-10-CM | POA: Diagnosis not present

## 2021-06-28 DIAGNOSIS — F17200 Nicotine dependence, unspecified, uncomplicated: Secondary | ICD-10-CM

## 2021-06-28 DIAGNOSIS — K219 Gastro-esophageal reflux disease without esophagitis: Secondary | ICD-10-CM | POA: Diagnosis not present

## 2021-06-28 DIAGNOSIS — Z23 Encounter for immunization: Secondary | ICD-10-CM

## 2021-06-28 DIAGNOSIS — Z2821 Immunization not carried out because of patient refusal: Secondary | ICD-10-CM

## 2021-06-28 MED ORDER — ALLOPURINOL 100 MG PO TABS: 200.0000 mg | ORAL_TABLET | Freq: Every day | ORAL | 1 refills | Status: DC

## 2021-06-28 MED ORDER — AMLODIPINE BESYLATE 5 MG PO TABS: 5.0000 mg | ORAL_TABLET | Freq: Every day | ORAL | 5 refills | Status: DC

## 2021-06-28 MED ORDER — OMEPRAZOLE 20 MG PO CPDR: 20.0000 mg | DELAYED_RELEASE_CAPSULE | Freq: Every day | ORAL | 3 refills | Status: DC

## 2021-06-28 MED ORDER — COLCHICINE 0.6 MG PO TABS: 0.6000 mg | ORAL_TABLET | Freq: Every day | ORAL | 5 refills | Status: DC

## 2021-06-28 NOTE — Patient Instructions (Signed)
Healthy Eating Following a healthy eating pattern may help you to achieve and maintain a healthy body weight, reduce the risk of chronic disease, and live a long and productive life. It is important to follow a healthy eating pattern at an appropriate calorie level for your body. Your nutritional needs should be metprimarily through food by choosing a variety of nutrient-rich foods. What are tips for following this plan? Reading food labels Read labels and choose the following: Reduced or low sodium. Juices with 100% fruit juice. Foods with low saturated fats and high polyunsaturated and monounsaturated fats. Foods with whole grains, such as whole wheat, cracked wheat, brown rice, and wild rice. Whole grains that are fortified with folic acid. This is recommended for women who are pregnant or who want to become pregnant. Read labels and avoid the following: Foods with a lot of added sugars. These include foods that contain brown sugar, corn sweetener, corn syrup, dextrose, fructose, glucose, high-fructose corn syrup, honey, invert sugar, lactose, malt syrup, maltose, molasses, raw sugar, sucrose, trehalose, or turbinado sugar. Do not eat more than the following amounts of added sugar per day: 6 teaspoons (25 g) for women. 9 teaspoons (38 g) for men. Foods that contain processed or refined starches and grains. Refined grain products, such as white flour, degermed cornmeal, white bread, and white rice. Shopping Choose nutrient-rich snacks, such as vegetables, whole fruits, and nuts. Avoid high-calorie and high-sugar snacks, such as potato chips, fruit snacks, and candy. Use oil-based dressings and spreads on foods instead of solid fats such as butter, stick margarine, or cream cheese. Limit pre-made sauces, mixes, and "instant" products such as flavored rice, instant noodles, and ready-made pasta. Try more plant-protein sources, such as tofu, tempeh, black beans, edamame, lentils, nuts, and  seeds. Explore eating plans such as the Mediterranean diet or vegetarian diet. Cooking Use oil to saut or stir-fry foods instead of solid fats such as butter, stick margarine, or lard. Try baking, boiling, grilling, or broiling instead of frying. Remove the fatty part of meats before cooking. Steam vegetables in water or broth. Meal planning  At meals, imagine dividing your plate into fourths: One-half of your plate is fruits and vegetables. One-fourth of your plate is whole grains. One-fourth of your plate is protein, especially lean meats, poultry, eggs, tofu, beans, or nuts. Include low-fat dairy as part of your daily diet.  Lifestyle Choose healthy options in all settings, including home, work, school, restaurants, or stores. Prepare your food safely: Wash your hands after handling raw meats. Keep food preparation surfaces clean by regularly washing with hot, soapy water. Keep raw meats separate from ready-to-eat foods, such as fruits and vegetables. Cook seafood, meat, poultry, and eggs to the recommended internal temperature. Store foods at safe temperatures. In general: Keep cold foods at 32F (4.4C) or below. Keep hot foods at 132F (60C) or above. Keep your freezer at [facility] (-17.8C) or below. Foods are no longer safe to eat when they have been between the temperatures of 40-132F (4.4-60C) for more than 2 hours. What foods should I eat? Fruits Aim to eat 2 cup-equivalents of fresh, canned (in natural juice), or frozen fruits each day. Examples of 1 cup-equivalent of fruit include 1 small apple, 8large strawberries, 1 cup canned fruit,  cup dried fruit, or 1 cup 100% juice. Vegetables Aim to eat 2-3 cup-equivalents of fresh and frozen vegetables each day, including different varieties and colors. Examples of 1 cup-equivalent of vegetables include 2 medium carrots, 2 cups raw, leafy  greens, 1 cup choppedvegetable (raw or cooked), or 1 medium baked potato. Grains Aim to  eat 6 ounce-equivalents of whole grains each day. Examples of 1 ounce-equivalent of grains include 1 slice of bread, 1 cup ready-to-eat cereal,3 cups popcorn, or  cup cooked rice, pasta, or cereal. Meats and other proteins Aim to eat 5-6 ounce-equivalents of protein each day. Examples of 1 ounce-equivalent of protein include 1 egg, 1/2 cup nuts or seeds, or 1 tablespoon (16 g) peanut butter. A cut of meat or fish that is the size of a deck of cards is about 3-4 ounce-equivalents. Of the protein you eat each week, try to have at least 8 ounces come from seafood. This includes salmon, trout, herring, and anchovies. Dairy Aim to eat 3 cup-equivalents of fat-free or low-fat dairy each day. Examples of 1 cup-equivalent of dairy include 1 cup (240 mL) milk, 8 ounces (250 g) yogurt,1 ounces (44 g) natural cheese, or 1 cup (240 mL) fortified soy milk. Fats and oils Aim for about 5 teaspoons (21 g) per day. Choose monounsaturated fats, such as canola and olive oils, avocados, peanut butter, and most nuts, or polyunsaturated fats, such as sunflower, corn, and soybean oils, walnuts, pine nuts, sesame seeds, sunflower seeds, and flaxseed. Beverages Aim for six 8-oz glasses of water per day. Limit coffee to three to five 8-oz cups per day. Limit caffeinated beverages that have added calories, such as soda and energy drinks. Limit alcohol intake to no more than 1 drink a day for nonpregnant women and 2 drinks a day for men. One drink equals 12 oz of beer (355 mL), 5 oz of wine (148 mL), or 1 oz of hard liquor (44 mL). Seasoning and other foods Avoid adding excess amounts of salt to your foods. Try flavoring foods with herbs and spices instead of salt. Avoid adding sugar to foods. Try using oil-based dressings, sauces, and spreads instead of solid fats. This information is based on general [place] nutrition guidelines. For more information, visit DisposableNylon.be. Exact amounts may vary based on your nutrition  needs. Summary A healthy eating plan may help you to maintain a healthy weight, reduce the risk of chronic diseases, and stay active throughout your life. Plan your meals. Make sure you eat the right portions of a variety of nutrient-rich foods. Try baking, boiling, grilling, or broiling instead of frying. Choose healthy options in all settings, including home, work, school, restaurants, or stores. This information is not intended to replace advice given to you by your health care provider. Make sure you discuss any questions you have with your healthcare provider. Document Revised: 03/16/2018 Document Reviewed: 03/16/2018 Elsevier Patient Education  2022 [employer]. Rethinking drinking (NIH Publication No. (704)569-8667). Retrieved from https://pubs.BikingRewards.com.cy.pdf">  Alcohol Use Disorder Alcohol use disorder is a condition in which drinking disrupts daily life. People with this condition drink too much alcohol and cannot control theirdrinking. Alcohol use disorder can cause serious problems with physical health. It can affect the brain, heart, and other internal organs. This disorder can raise the risk for certain cancers and cause problems with mental health, such asdepression or anxiety. What are the causes? This condition is caused by drinking too much alcohol over time. Some people with this condition drink to cope with or escape from negative life events.Others drink to relieve pain or symptoms of mental illness. What increases the risk? You are more likely to develop this condition if: You have a family history of alcohol use disorder. Your culture encourages drinking  to the point of becoming drunk (intoxication). You had a mood or conduct disorder in childhood. You have been abused. You are an adolescent and you: Have poor performance in school. Have poor supervision or guidance. Act on impulse and like taking risks. What are the  signs or symptoms? Symptoms of this condition include: Drinking more than you want to. Trying several times without success to drink less. Spending a lot of time thinking about alcohol, getting alcohol, drinking, or recovering from drinking. Continuing to drink even when it is causing serious problems in your daily life. Drinking when it is dangerous to drink, such as before driving a car. Needing more and more alcohol to get the same effect you want (building up tolerance). Having symptoms of withdrawal when you stop drinking. Withdrawal symptoms may include: Trouble sleeping, leading to tiredness (fatigue). Mood swings of depression and anxiety. Physical symptoms, such as a fast heart rate, rapid breathing, high blood pressure (hypertension), fever, cold sweats, or nausea. Seizures. Severe confusion. Feeling or seeing things that are not there (hallucinations). Shaking movements that you cannot control (tremors). How is this diagnosed? This condition is diagnosed with an assessment. Your health care provider may start by asking three or four questions about your drinking, or he or she maygive you a simple test to take. This helps to get clear information from you. You may also have a physical exam or lab tests. You may be referred to asubstance abuse counselor. How is this treated? With education, some people with alcohol use disorder are able to reduce their drinking. Many with this disorder cannot change their drinking behavior on their own and need help from substance use specialists. These specialists are counselors who can help diagnose how severe your disorder is and what type of treatment you need. Treatments may include: Detoxification. Detoxification involves quitting drinking with supervision and direction of health care providers. Your health care provider may prescribe prescription medicines within the first week to help lessen withdrawal symptoms. Alcohol withdrawal can be  dangerous and life-threatening. Detoxification may be provided in a home, community, or primary care setting, or in a hospital or substance use treatment facility. Counseling. This may involve motivational interviewing (MI), family therapy, or cognitive behavioral therapy (CBT). It is provided by substance use treatment counselors or professional therapists. A counselor can address the things you can do to change your drinking behavior and how to maintain the changes. Talk therapy aims to: Identify your positive motivations to change. Identify and avoid the things that trigger your drinking. Help you learn how to plan your behavior change. Develop support systems that can help you sustain the change. Medicines. Medicines can help treat this disorder by: Decreasing cravings. Decreasing the positive feeling you have when you drink. Causing an uncomfortable physical reaction when you drink (aversion therapy). Mutual help groups such as Alcoholics Anonymous (AA). These groups are led by people who have quit drinking. The groups provide emotional support, advice, and guidance. Some people with this condition benefit from a combination of treatmentsprovided by specialized substance use treatment centers. Follow these instructions at home:  Medicines Take over-the-counter and prescription medicines only as told by your health care provider. Ask before starting any new medicines, herbs, or supplements. General instructions Ask friends and family members to support your choice to stay sober. Avoid situations where alcohol is served. Create a plan to deal with tempting situations. Attend support groups regularly. Practice hobbies or activities you enjoy. Do not drink and drive. Keep all follow-up  visits as told by your health care provider. This is important. How is this prevented? If you drink alcohol: Limit how much you use to: 0-1 drink a day for nonpregnant women. 0-2 drinks a day for men. Be  aware of how much alcohol is in your drink. In the [place] one drink equals one 12 oz bottle of beer (355 mL), one 5 oz glass of wine (148 mL), or one 1 oz glass of hard liquor (44 mL). If you have a mental health condition, seek treatment. Develop a healthy lifestyle through: Meditation or deep breathing. Exercise. Spending time in nature. Listening to music. Talking with a trusted friend or family member. If you are an adolescent: Do not drink alcohol. Avoid gatherings where you might be tempted. Do not be afraid to say no if someone offers you alcohol. Speak up about why you do not want to drink. Set a positive example for others around you by not drinking. Build relationships with friends who do not drink. Where to find more information Substance Abuse and Mental Health Services Administration: RockToxic.pl Alcoholics Anonymous: CustomizedRugs.fi Contact a health care provider if: You cannot take your medicines as told. Your symptoms get worse or you experience symptoms of withdrawal when you stop drinking. You start drinking again (relapse) and your symptoms get worse. Get help right away if: You have thoughts about hurting yourself or others. If you ever feel like you may hurt yourself or others, or have thoughts about taking your own life, get help right away. Go to your nearest emergency department or: Call your local emergency services (911 in the [place] Call a suicide crisis helpline, such as the National Suicide Prevention Lifeline at 360-491-4179. This is open 24 hours a day in the [place] Text the Crisis Text Line at (587)864-7356 (in the [place] Summary Alcohol use disorder is a condition in which drinking disrupts daily life. People with this condition drink too much alcohol and cannot control their drinking. Treatment may include detoxification, counseling, medicines, and support groups. Ask friends and family members to support you. Avoid situations where alcohol is served. Get help right away  if you have thoughts about hurting yourself or others. This information is not intended to replace advice given to you by your health care provider. Make sure you discuss any questions you have with your healthcare provider. Document Revised: 10/21/2019 Document Reviewed: 10/21/2019 Elsevier Patient Education  2022 [employer].

## 2021-06-28 NOTE — Progress Notes (Signed)
Patient ID: Jesus Phillips, male    DOB: [birth year]  MRN: [id number]  CC: Annual Exam and Gastroesophageal Reflux   Subjective: Jesus Phillips is a 35 y.o. male who presents for annual exam His concerns today include:  Patient with history of probable gout, tobacco dependence, ETOH use disorder  Presumed Gout:  no flare up this year.  Request RF on Allopurinol and Colchicine.  Taking Colchicine PRN and has not taken Allopurinol in a while either.    Tob dep: smoking 1 pk/Q2-3 days. Mentally not ready to quit but states his mom, daughter and his GF have been encouraging him to quit.  Still drinking when he gets off work at nights.  Drinks a beer and several shots every night.    BP elev today.  No hx of HTN but pt states whenever he was seen at [facility], he was told BP was elev. Does not use a lot of salt in foods No CP but bothered with GERD.  Burp sand gets acid taste at back of throat with certain foods like piza and spaghetti.  Also comes on when he eats and lays down at nights.  Uses TUMs PRN.  Not on NSAIDs.   Overwgh:  lives with his GF.  Reports his GF does not cook much.  They eat out a lot.   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   HM:  Reports having had 2 shots of COVID vaccine, not sure which one and does not have card with him  Due for Pneumonia vaccine based on hx of tob use.  He wants to defer on getting this today   Patient Active Problem List   Diagnosis Date Noted   History of gout 02/03/2020   Plantar fasciitis 02/03/2020   Alcohol use disorder, mild, abuse 02/03/2020     Current Outpatient Medications on File Prior to Visit  Medication Sig Dispense Refill   allopurinol (ZYLOPRIM) 100 MG tablet Take 2 tablets (200 mg total) by mouth daily. 180 tablet 1   colchicine 0.6 MG tablet Take 1 tablet (0.6 mg total) by mouth daily. 30 tablet 5   meloxicam (MOBIC) 15 MG tablet Take 1 tablet (15 mg total) by mouth daily.  Start taking after the completion of the oral steroid medication (Patient not taking: Reported on 03/01/2021) 30 tablet 0   methylPREDNISolone (MEDROL DOSEPAK) 4 MG TBPK tablet Take taper as directed in package instructions (Patient not taking: Reported on 03/01/2021) 21 tablet 1   No current facility-administered medications on file prior to visit.    No Known Allergies  Social History   Socioeconomic History   Marital status: Single    Spouse name: Not on file   Number of children: Not on file   Years of education: Not on file   Highest education level: Not on file  Occupational History   Not on file  Tobacco Use   Smoking status: Every Day    Types: Cigarettes   Smokeless tobacco: Never  Vaping Use   Vaping Use: Never used  Substance and Sexual Activity   Alcohol use: Yes   Drug use: Yes    Types: Marijuana   Sexual activity: Yes    Birth control/protection: Condom  Other Topics Concern   Not on file  Social History Narrative   Not on file   Social Determinants of Health   Financial Resource Strain: Not on file  Food Insecurity: Not  on file  Transportation Needs: Not on file  Physical Activity: Not on file  Stress: Not on file  Social Connections: Not on file  Intimate Partner Violence: Not on file    No family history on file.  No past surgical history on file.  ROS: Review of Systems  Constitutional:  Negative for activity change and fatigue.  HENT:  Negative for congestion, hearing loss, sinus pressure and sore throat.        He gets routine dental care through his dentist.  Eyes:  Negative for visual disturbance.       He gets an eye exam once a year.  He wears correction lenses for distance.  Respiratory:  Negative for chest tightness and shortness of breath.   Cardiovascular:  Negative for chest pain and palpitations.  Gastrointestinal:  Negative for abdominal pain and blood in stool.  Endocrine: Negative for polydipsia and polyuria.  Genitourinary:   Negative for dysuria, hematuria and penile discharge.  Musculoskeletal:        No joint pains at this time.  Skin:  Negative for rash.  Neurological:  Negative for dizziness and headaches.  Psychiatric/Behavioral:  Negative for dysphoric mood. The patient is not nervous/anxious.     PHYSICAL EXAM: BP (!) 177/130 (BP Location: Left Arm, Patient Position: Sitting, Cuff Size: Large)   Pulse (!) 112   Resp 16   Ht 6\' 3"  (1.905 m)   Wt 225 lb 3.2 oz (102.2 kg)   SpO2 100%   BMI 28.15 kg/m   Wt Readings from Last 3 Encounters:  06/28/21 225 lb 3.2 oz (102.2 kg)  06/27/16 250 lb (113.4 kg)  06/27/15 234 lb 8 oz (106.4 kg)    Physical Exam   General appearance - alert, well appearing, young to middle-aged African-American male and in no distress Mental status - normal mood, behavior, speech, dress, motor activity, and thought processes Eyes -mildly pale conjunctiva Ears - bilateral TM's and external ear canals normal Nose - normal and patent, no erythema, discharge or polyps Mouth - mucous membranes moist, pharynx normal without lesions Neck - supple, no significant adenopathy Lymphatics - no palpable lymphadenopathy, no hepatosplenomegaly Chest - clear to auscultation, no wheezes, rales or rhonchi, symmetric air entry Heart - normal rate, regular rhythm, normal S1, S2, no murmurs, rubs, clicks or gallops Abdomen - soft, nontender, nondistended, no masses or organomegaly Musculoskeletal - no joint tenderness, deformity or swelling Extremities - peripheral pulses normal, no pedal edema, no clubbing or cyanosis Skin - normal coloration and turgor, no rashes, no suspicious skin lesions noted  Depression screen [facility] 2/9 06/28/2021 02/03/2020 06/27/2015  Decreased Interest 0 0 0  Down, Depressed, Hopeless 0 0 0  PHQ - 2 Score 0 0 0   GAD 7 : Generalized Anxiety Score 06/28/2021 02/03/2020  Nervous, Anxious, on Edge 0 0  Control/stop worrying 0 0  Worry too much - different things 0 0   Trouble relaxing 0 0  Restless 0 0  Easily annoyed or irritable 0 0  Afraid - awful might happen 0 0  Total GAD 7 Score 0 0      CMP Latest Ref Rng & Units 02/07/2020  Glucose 65 - 99 mg/dL 02/09/2020)  BUN 6 - 20 mg/dL 14  Creatinine 409(W - 1.19 mg/dL 1.47  Sodium 8.29 - 562 mmol/L 142  Potassium 3.5 - 5.2 mmol/L 4.8  Chloride 96 - 106 mmol/L 103  CO2 20 - 29 mmol/L 20  Calcium 8.7 - 10.2 mg/dL  10.0  Total Protein 6.0 - 8.5 g/dL 7.7  Total Bilirubin 0.0 - 1.2 mg/dL 0.4  Alkaline Phos 39 - 117 IU/L 147(H)  AST 0 - 40 IU/L 23  ALT 0 - 44 IU/L 27   Lipid Panel     Component Value Date/Time   CHOL 175 02/07/2020 0855   TRIG 164 (H) 02/07/2020 0855   HDL 50 02/07/2020 0855   CHOLHDL 3.5 02/07/2020 0855   LDLCALC 97 02/07/2020 0855    CBC    Component Value Date/Time   WBC 8.0 02/07/2020 0855   RBC 4.69 02/07/2020 0855   HGB 14.1 02/07/2020 0855   HCT 41.3 02/07/2020 0855   PLT 475 (H) 02/07/2020 0855   MCV 88 02/07/2020 0855   MCH 30.1 02/07/2020 0855   MCHC 34.1 02/07/2020 0855   RDW 11.8 02/07/2020 0855    ASSESSMENT AND PLAN: 1. Annual physical exam Discussed appropriate health screenings for age. Went over how much is too much for a male to drink in a day.  Discussed health risks associated with excessive alcohol use.  Encouraged him to cut back on the amount that he is currently drinking to no more than 1-2 standard drinks a day but preferably less.  2. Tobacco dependence Strongly advised to quit smoking.  Discussed health risks associated with smoking.  Patient not ready to give a trial of quitting.  3. Essential hypertension Looking back at his flowsheet, looks like patient has had persistent elevation of diastolic blood pressure.  DASH diet discussed and encouraged.  Patient also given the option of starting blood pressure medication today versus coming back to see the clinical pharmacist in 2 weeks.  He is agreeable to starting today.  He will still follow-up  with the clinical pharmacist in 2 weeks for repeat blood pressure check. - CBC - Comprehensive metabolic panel - Lipid panel - amLODipine (NORVASC) 5 MG tablet; Take 1 tablet (5 mg total) by mouth daily.  Dispense: 30 tablet; Refill: 5  4. Overweight (BMI 25.0-29.9) Discussed and encourage healthy eating habits and the importance of getting in at least 150 minutes/week of moderate intensity exercise.  Printed information given on healthy eating habits.  5. Gastroesophageal reflux disease without esophagitis GERD precautions discussed.  Advised to avoid certain foods like spicy foods, tomato-based foods, juices and excessive caffeine.  Advised to eat his last meal at least 2 to 3 hours before laying down at nights and to sleep with his head slightly elevated.   - omeprazole (PRILOSEC) 20 MG capsule; Take 1 capsule (20 mg total) by mouth daily. For acid reflux  Dispense: 30 capsule; Refill: 3  6. Alcohol use disorder, mild, abuse See #1 above  7. History of gout Advised patient that the allopurinol needs to be taken daily not as needed.  He will restart the allopurinol.  I told him to take the colchicine with it at least for the first 2 to 3 months to prevent gout flare and then after that he can take the colchicine as needed. - Uric Acid - colchicine 0.6 MG tablet; Take 1 tablet (0.6 mg total) by mouth daily.  Dispense: 30 tablet; Refill: 5 - allopurinol (ZYLOPRIM) 100 MG tablet; Take 2 tablets (200 mg total) by mouth daily.  Dispense: 180 tablet; Refill: 1  8. COVID-19 vaccine series started Recommend that he brings a copy of his card so we can update his health maintenance.  Encouraged him to get the COVID booster shot.  9. 23-polyvalent pneumococcal polysaccharide  vaccine declined   10. Need for hepatitis C screening test - Hepatitis C Antibody   Patient was given the opportunity to ask questions.  Patient verbalized understanding of the plan and was able to repeat key elements of  the plan.   No orders of the defined types were placed in this encounter.    Requested Prescriptions    No prescriptions requested or ordered in this encounter    No follow-ups on file.  Jonah Blueeborah Daijon Wenke, MD, FACP

## 2021-06-29 ENCOUNTER — Telehealth: Payer: Self-pay

## 2021-06-29 LAB — COMPREHENSIVE METABOLIC PANEL
ALT: 33 IU/L (ref 0–44)
AST: 37 IU/L (ref 0–40)
Albumin/Globulin Ratio: 2.2 (ref 1.2–2.2)
Albumin: 5.2 g/dL — ABNORMAL HIGH (ref 4.0–5.0)
Alkaline Phosphatase: 106 IU/L (ref 44–121)
BUN/Creatinine Ratio: 12 (ref 9–20)
BUN: 14 mg/dL (ref 6–20)
Bilirubin Total: 0.4 mg/dL (ref 0.0–1.2)
CO2: 22 mmol/L (ref 20–29)
Calcium: 10 mg/dL (ref 8.7–10.2)
Chloride: 101 mmol/L (ref 96–106)
Creatinine, Ser: 1.13 mg/dL (ref 0.76–1.27)
Globulin, Total: 2.4 g/dL (ref 1.5–4.5)
Glucose: 86 mg/dL (ref 65–99)
Potassium: 4.6 mmol/L (ref 3.5–5.2)
Sodium: 140 mmol/L (ref 134–144)
Total Protein: 7.6 g/dL (ref 6.0–8.5)
eGFR: 88 mL/min/{1.73_m2} (ref >59)

## 2021-06-29 LAB — CBC
Hematocrit: 42.1 % (ref 37.5–51.0)
Hemoglobin: 13.9 g/dL (ref 13.0–17.7)
MCH: 30 pg (ref 26.6–33.0)
MCHC: 33 g/dL (ref 31.5–35.7)
MCV: 91 fL (ref 79–97)
Platelets: 382 10*3/uL (ref 150–450)
RBC: 4.64 x10E6/uL (ref 4.14–5.80)
RDW: 12.3 % (ref 11.6–15.4)
WBC: 6.3 10*3/uL (ref 3.4–10.8)

## 2021-06-29 LAB — URIC ACID: Uric Acid: 12.4 mg/dL — ABNORMAL HIGH (ref 3.8–8.4)

## 2021-06-29 LAB — LIPID PANEL
Chol/HDL Ratio: 3.4 ratio (ref 0.0–5.0)
Cholesterol, Total: 208 mg/dL — ABNORMAL HIGH (ref 100–199)
HDL: 61 mg/dL (ref >39)
LDL Chol Calc (NIH): 78 mg/dL (ref 0–99)
Triglycerides: 435 mg/dL — ABNORMAL HIGH (ref 0–149)
VLDL Cholesterol Cal: 69 mg/dL — ABNORMAL HIGH (ref 5–40)

## 2021-06-29 LAB — HEPATITIS C ANTIBODY: Hep C Virus Ab: <0.1 s/co ratio (ref 0.0–0.9)

## 2021-06-29 NOTE — Telephone Encounter (Signed)
Contacted pt to go over lab results pt is aware and doesn't have any questions or concerns 

## 2021-06-29 NOTE — Progress Notes (Signed)
Blood cell counts including red blood cells, white blood cells and platelet counts are normal.  Kidney and liver function tests are normal.  Elevation in triglyceride which is a cholesterol level.  Healthy eating habits and regular exercise as discussed on recent visit will help to get this down.  Uric acid level is elevated due to gout.  He should restart allopurinol and colchicine as discussed on recent visit.  We will recheck the level on his next visit.  Our goal is to get this to around 6.  Screen for hepatitis C is negative.

## 2021-07-09 NOTE — Progress Notes (Unsigned)
   PCP: Dr. Laural Benes PMH: HTN, gout, GERD  S:    Patient arrives ***. Presents to the clinic for hypertension evaluation, counseling, and management. Patient was referred and last seen by Primary Care Provider on 06/28/21, at which time BP was 177/130, on recheck 144/100. Patient was diagnosed with hypertension and started on amlodipine 5 mg daily.   Compliance? Took meds this morning? When do you take your meds? Dizziness, headaches, blurred vision, swelling? Check Clinic BP? Home BP logs? If no logs, bring to next visit w/ BP cuff Go over BP goals Additional BP therapy if needed Could increase amlo to 10 Diet/exercise?  Sees Dr. Laural Benes next in November  Medication adherence *** .  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 Mentally not ready to quit but states his mom, daughter and his GF have been encouraging him to quit.  Still drinking when he gets off work at nights.  Drinks a beer and several shots every night. Fhx: not on file    O:  Home BP readings: ***  Last 3 Office BP readings: BP Readings from Last 3 Encounters:  06/28/21 (!) 144/100  06/18/20 (!) 144/97  04/13/17 125/83    BMET    Component Value Date/Time   NA 140 06/28/2021 1122   K 4.6 06/28/2021 1122   CL 101 06/28/2021 1122   CO2 22 06/28/2021 1122   GLUCOSE 86 06/28/2021 1122   BUN 14 06/28/2021 1122   CREATININE 1.13 06/28/2021 1122   CALCIUM 10.0 06/28/2021 1122   GFRNONAA 90 02/07/2020 0855   GFRAA 104 02/07/2020 0855    Renal function: Estimated Creatinine Clearance: 120.5 mL/min (by C-G formula based on SCr of 1.13 mg/dL).  Clinical ASCVD: {YES/NO:21197} The ASCVD Risk score Denman George DC Jr., et al., 2013) failed to calculate for the following reasons:   The 2013  ASCVD risk score is only valid for ages 22 to 68   A/P: Hypertension diagnosed *** currently *** on current medications. BP Goal = < 130/80 mmHg. Medication adherence ***.  -{Meds adjust:18428} ***.  -F/u labs ordered - *** -Counseled on lifestyle modifications for blood pressure control including reduced dietary sodium, increased exercise, adequate sleep.  Results reviewed and written information provided.   Total time in face-to-face counseling *** minutes.   F/U Clinic Visit in ***.

## 2021-07-11 ENCOUNTER — Ambulatory Visit: Payer: PRIVATE HEALTH INSURANCE | Admitting: Pharmacist

## 2021-08-14 ENCOUNTER — Ambulatory Visit: Payer: Self-pay | Admitting: *Deleted

## 2021-08-14 ENCOUNTER — Ambulatory Visit: Payer: PRIVATE HEALTH INSURANCE | Admitting: Nurse Practitioner

## 2021-08-14 NOTE — Telephone Encounter (Signed)
Contacted pt to schedule an appt pt vm is full

## 2021-08-14 NOTE — Telephone Encounter (Signed)
Patient Reason for Disposition  [1] SEVERE pain (e.g., excruciating, unable to walk) AND [2] not improved after 2 hours of pain medicine  Answer Assessment - Initial Assessment Questions 1. LOCATION and RADIATION: "Where is the pain located?"      left 2. QUALITY: "What does the pain feel like?"  (e.g., sharp, dull, aching, burning)     Sharp pain 3. SEVERITY: "How bad is the pain?" "What does it keep you from doing?"   (Scale 1-10; or mild, moderate, severe)   -  MILD (1-3): doesn't interfere with normal activities    -  MODERATE (4-7): interferes with normal activities (e.g., work or school) or awakens from sleep, limping    -  SEVERE (8-10): excruciating pain, unable to do any normal activities, unable to walk     severe 4. ONSET: "When did the pain start?" "Does it come and go, or is it there all the time?"     Sunday 5. RECURRENT: "Have you had this pain before?" If Yes, ask: "When, and what happened then?"     Gout flare 6. SETTING: "Has there been any recent work, exercise or other activity that involved that part of the body?"      No- gout flare 7. AGGRAVATING FACTORS: "What makes the knee pain worse?" (e.g., walking, climbing stairs, running)     moving 8. ASSOCIATED SYMPTOMS: "Is there any swelling or redness of the knee?"     swelling 9. OTHER SYMPTOMS: "Do you have any other symptoms?" (e.g., chest pain, difficulty breathing, fever, calf pain)     no 10. PREGNANCY: "Is there any chance you are pregnant?" "When was your last menstrual period?"       N/a  Protocols used: Knee Pain-A-AH

## 2021-09-15 ENCOUNTER — Other Ambulatory Visit: Payer: Self-pay | Admitting: Internal Medicine

## 2021-09-15 DIAGNOSIS — K219 Gastro-esophageal reflux disease without esophagitis: Secondary | ICD-10-CM

## 2021-09-15 NOTE — Telephone Encounter (Signed)
Too soon- last RF 06/28/21 #30 3 RF- pt has enough refill to last until upcoming appt.

## 2021-10-01 ENCOUNTER — Other Ambulatory Visit: Payer: Self-pay | Admitting: Internal Medicine

## 2021-10-01 DIAGNOSIS — K219 Gastro-esophageal reflux disease without esophagitis: Secondary | ICD-10-CM

## 2021-10-01 NOTE — Telephone Encounter (Signed)
Requested Prescriptions  Pending Prescriptions Disp Refills  . omeprazole (PRILOSEC) 20 MG capsule [Pharmacy Med Name: OMEPRAZOLE DR 20 MG CAPSULE] 90 capsule 1    Sig: TAKE 1 CAPSULE (20 MG TOTAL) BY MOUTH DAILY. FOR ACID REFLUX     Gastroenterology: Proton Pump Inhibitors Passed - 10/01/2021  1:20 AM      Passed - Valid encounter within last 12 months    Recent Outpatient Visits          3 months ago Annual physical exam   [facility] Marcine Matar, MD   7 months ago History of gout   [facility] [place], [place], [place]   1 year ago History of gout   [facility] Marcine Matar, MD   6 years ago Acute idiopathic gout, unspecified site   [facility] [place], [place], [place]      Future Appointments            In 3 weeks Marcine Matar, MD [facility]

## 2021-10-18 ENCOUNTER — Other Ambulatory Visit: Payer: Self-pay

## 2021-10-18 ENCOUNTER — Observation Stay (HOSPITAL_COMMUNITY): Payer: PRIVATE HEALTH INSURANCE

## 2021-10-18 ENCOUNTER — Encounter (HOSPITAL_COMMUNITY): Payer: Self-pay | Admitting: *Deleted

## 2021-10-18 ENCOUNTER — Observation Stay (HOSPITAL_COMMUNITY)
Admission: EM | Admit: 2021-10-18 | Discharge: 2021-10-19 | Disposition: A | Discharge status: Home / Self Care | Payer: PRIVATE HEALTH INSURANCE | Attending: Internal Medicine | Admitting: Internal Medicine

## 2021-10-18 DIAGNOSIS — E860 Dehydration: Secondary | ICD-10-CM | POA: Insufficient documentation

## 2021-10-18 DIAGNOSIS — Y900 Blood alcohol level of less than 20 mg/100 ml: Secondary | ICD-10-CM | POA: Insufficient documentation

## 2021-10-18 DIAGNOSIS — Z20822 Contact with and (suspected) exposure to covid-19: Secondary | ICD-10-CM | POA: Insufficient documentation

## 2021-10-18 DIAGNOSIS — F1010 Alcohol abuse, uncomplicated: Secondary | ICD-10-CM | POA: Diagnosis present

## 2021-10-18 DIAGNOSIS — R197 Diarrhea, unspecified: Secondary | ICD-10-CM | POA: Insufficient documentation

## 2021-10-18 DIAGNOSIS — N179 Acute kidney failure, unspecified: Secondary | ICD-10-CM | POA: Diagnosis not present

## 2021-10-18 DIAGNOSIS — R112 Nausea with vomiting, unspecified: Principal | ICD-10-CM | POA: Insufficient documentation

## 2021-10-18 DIAGNOSIS — E663 Overweight: Secondary | ICD-10-CM | POA: Diagnosis present

## 2021-10-18 DIAGNOSIS — F17200 Nicotine dependence, unspecified, uncomplicated: Secondary | ICD-10-CM | POA: Diagnosis present

## 2021-10-18 DIAGNOSIS — F17210 Nicotine dependence, cigarettes, uncomplicated: Secondary | ICD-10-CM | POA: Diagnosis not present

## 2021-10-18 DIAGNOSIS — I10 Essential (primary) hypertension: Secondary | ICD-10-CM | POA: Diagnosis present

## 2021-10-18 DIAGNOSIS — Z79899 Other long term (current) drug therapy: Secondary | ICD-10-CM | POA: Diagnosis not present

## 2021-10-18 DIAGNOSIS — R109 Unspecified abdominal pain: Secondary | ICD-10-CM

## 2021-10-18 DIAGNOSIS — K219 Gastro-esophageal reflux disease without esophagitis: Secondary | ICD-10-CM | POA: Diagnosis not present

## 2021-10-18 DIAGNOSIS — R7989 Other specified abnormal findings of blood chemistry: Secondary | ICD-10-CM

## 2021-10-18 DIAGNOSIS — Z8739 Personal history of other diseases of the musculoskeletal system and connective tissue: Secondary | ICD-10-CM

## 2021-10-18 LAB — CBC
HCT: 55.6 % — ABNORMAL HIGH (ref 39.0–52.0)
HCT: 50.7 % (ref 39.0–52.0)
Hemoglobin: 18.5 g/dL — ABNORMAL HIGH (ref 13.0–17.0)
Hemoglobin: 16.4 g/dL (ref 13.0–17.0)
MCH: 30.9 pg (ref 26.0–34.0)
MCH: 30.6 pg (ref 26.0–34.0)
MCHC: 33.3 g/dL (ref 30.0–36.0)
MCHC: 32.3 g/dL (ref 30.0–36.0)
MCV: 92.8 fL (ref 80.0–100.0)
MCV: 94.6 fL (ref 80.0–100.0)
Platelets: 470 10*3/uL — ABNORMAL HIGH (ref 150–400)
Platelets: 414 10*3/uL — ABNORMAL HIGH (ref 150–400)
RBC: 5.99 MIL/uL — ABNORMAL HIGH (ref 4.22–5.81)
RBC: 5.36 MIL/uL (ref 4.22–5.81)
RDW: 13.3 % (ref 11.5–15.5)
RDW: 13.3 % (ref 11.5–15.5)
WBC: 13.7 10*3/uL — ABNORMAL HIGH (ref 4.0–10.5)
WBC: 15.2 10*3/uL — ABNORMAL HIGH (ref 4.0–10.5)
nRBC: 0 % (ref 0.0–0.2)
nRBC: 0 % (ref 0.0–0.2)

## 2021-10-18 LAB — RAPID URINE DRUG SCREEN, HOSP PERFORMED
Amphetamines: NOT DETECTED
Barbiturates: NOT DETECTED
Benzodiazepines: NOT DETECTED
Cocaine: NOT DETECTED
Opiates: NOT DETECTED
Tetrahydrocannabinol: POSITIVE — AB

## 2021-10-18 LAB — COMPREHENSIVE METABOLIC PANEL
ALT: 47 U/L — ABNORMAL HIGH (ref 0–44)
AST: 84 U/L — ABNORMAL HIGH (ref 15–41)
Albumin: 5.7 g/dL — ABNORMAL HIGH (ref 3.5–5.0)
Alkaline Phosphatase: 141 U/L — ABNORMAL HIGH (ref 38–126)
Anion gap: 16 — ABNORMAL HIGH (ref 5–15)
BUN: 23 mg/dL — ABNORMAL HIGH (ref 6–20)
CO2: 20 mmol/L — ABNORMAL LOW (ref 22–32)
Calcium: 10.7 mg/dL — ABNORMAL HIGH (ref 8.9–10.3)
Chloride: 100 mmol/L (ref 98–111)
Creatinine, Ser: 2.23 mg/dL — ABNORMAL HIGH (ref 0.61–1.24)
GFR, Estimated: 39 mL/min — ABNORMAL LOW (ref >60)
Glucose, Bld: 138 mg/dL — ABNORMAL HIGH (ref 70–99)
Potassium: 4 mmol/L (ref 3.5–5.1)
Sodium: 136 mmol/L (ref 135–145)
Total Bilirubin: 1.4 mg/dL — ABNORMAL HIGH (ref 0.3–1.2)
Total Protein: 9.7 g/dL — ABNORMAL HIGH (ref 6.5–8.1)

## 2021-10-18 LAB — URINALYSIS, ROUTINE W REFLEX MICROSCOPIC
Bilirubin Urine: NEGATIVE
Glucose, UA: NEGATIVE mg/dL
Ketones, ur: 5 mg/dL — AB
Leukocytes,Ua: NEGATIVE
Nitrite: NEGATIVE
Protein, ur: 100 mg/dL — AB
Specific Gravity, Urine: 1.021 (ref 1.005–1.030)
pH: 5 (ref 5.0–8.0)

## 2021-10-18 LAB — HEPATITIS PANEL, ACUTE
HCV Ab: NONREACTIVE
Hep A IgM: NONREACTIVE
Hep B C IgM: NONREACTIVE
Hepatitis B Surface Ag: NONREACTIVE

## 2021-10-18 LAB — RESP PANEL BY RT-PCR (FLU A&B, COVID) ARPGX2
Influenza A by PCR: NEGATIVE
Influenza B by PCR: NEGATIVE
SARS Coronavirus 2 by RT PCR: NEGATIVE

## 2021-10-18 LAB — LIPASE, BLOOD: Lipase: 48 U/L (ref 11–51)

## 2021-10-18 LAB — ETHANOL: Alcohol, Ethyl (B): <10 mg/dL (ref <10)

## 2021-10-18 LAB — HIV ANTIBODY (ROUTINE TESTING W REFLEX): HIV Screen 4th Generation wRfx: NONREACTIVE

## 2021-10-18 LAB — LACTIC ACID, PLASMA: Lactic Acid, Venous: 2 mmol/L (ref 0.5–1.9)

## 2021-10-18 LAB — CREATININE, SERUM
Creatinine, Ser: 1.59 mg/dL — ABNORMAL HIGH (ref 0.61–1.24)
GFR, Estimated: 58 mL/min — ABNORMAL LOW (ref >60)

## 2021-10-18 MED ORDER — AMLODIPINE BESYLATE 10 MG PO TABS
10.0000 mg | ORAL_TABLET | Freq: Every day | ORAL | Status: DC
  Administered 2021-10-19: 10 mg via ORAL
  Filled 2021-10-18: qty 1

## 2021-10-18 MED ORDER — ACETAMINOPHEN 650 MG RE SUPP: 650.0000 mg | Freq: Four times a day (QID) | RECTAL | Status: DC | PRN

## 2021-10-18 MED ORDER — METOCLOPRAMIDE HCL 5 MG/ML IJ SOLN
10.0000 mg | Freq: Once | INTRAMUSCULAR | Status: AC
  Administered 2021-10-18: 10 mg via INTRAVENOUS
  Filled 2021-10-18: qty 2

## 2021-10-18 MED ORDER — PROMETHAZINE HCL 25 MG PO TABS: 12.5000 mg | ORAL_TABLET | Freq: Four times a day (QID) | ORAL | Status: DC | PRN

## 2021-10-18 MED ORDER — HYDRALAZINE HCL 20 MG/ML IJ SOLN
10.0000 mg | Freq: Four times a day (QID) | INTRAMUSCULAR | Status: DC | PRN
  Administered 2021-10-18: 10 mg via INTRAVENOUS
  Filled 2021-10-18: qty 1

## 2021-10-18 MED ORDER — PANTOPRAZOLE SODIUM 40 MG IV SOLR: 40.0000 mg | Freq: Every day | INTRAVENOUS | Status: DC

## 2021-10-18 MED ORDER — AMLODIPINE BESYLATE 5 MG PO TABS
5.0000 mg | ORAL_TABLET | Freq: Once | ORAL | Status: AC
  Administered 2021-10-18: 5 mg via ORAL
  Filled 2021-10-18: qty 1

## 2021-10-18 MED ORDER — LOPERAMIDE HCL 2 MG PO CAPS: 2.0000 mg | ORAL_CAPSULE | ORAL | Status: DC | PRN

## 2021-10-18 MED ORDER — ACETAMINOPHEN 325 MG PO TABS: 650.0000 mg | ORAL_TABLET | Freq: Four times a day (QID) | ORAL | Status: DC | PRN

## 2021-10-18 MED ORDER — SODIUM CHLORIDE 0.9 % IV SOLN: INTRAVENOUS | Status: DC

## 2021-10-18 MED ORDER — NICOTINE 14 MG/24HR TD PT24
14.0000 mg | MEDICATED_PATCH | Freq: Every day | TRANSDERMAL | Status: DC
  Administered 2021-10-18 – 2021-10-19 (×2): 14 mg via TRANSDERMAL
  Filled 2021-10-18 (×2): qty 1

## 2021-10-18 MED ORDER — ONDANSETRON 4 MG PO TBDP
4.0000 mg | ORAL_TABLET | Freq: Once | ORAL | Status: AC | PRN
  Administered 2021-10-18: 4 mg via ORAL
  Filled 2021-10-18: qty 1

## 2021-10-18 MED ORDER — ENOXAPARIN SODIUM 40 MG/0.4ML IJ SOSY
40.0000 mg | PREFILLED_SYRINGE | INTRAMUSCULAR | Status: DC
  Administered 2021-10-18: 40 mg via SUBCUTANEOUS
  Filled 2021-10-18: qty 0.4

## 2021-10-18 MED ORDER — THIAMINE HCL 100 MG PO TABS
100.0000 mg | ORAL_TABLET | Freq: Every day | ORAL | Status: DC
  Administered 2021-10-18 – 2021-10-19 (×2): 100 mg via ORAL
  Filled 2021-10-18 (×2): qty 1

## 2021-10-18 MED ORDER — AMLODIPINE BESYLATE 5 MG PO TABS: 5.0000 mg | ORAL_TABLET | Freq: Every day | ORAL | Status: DC

## 2021-10-18 MED ORDER — LACTATED RINGERS IV BOLUS
2000.0000 mL | Freq: Once | INTRAVENOUS | Status: AC
  Administered 2021-10-18: 2000 mL via INTRAVENOUS

## 2021-10-18 MED ORDER — HYDROCODONE-ACETAMINOPHEN 5-325 MG PO TABS: 1.0000 | ORAL_TABLET | ORAL | Status: DC | PRN

## 2021-10-18 NOTE — H&P (Signed)
History and Physical    DOA: 10/18/2021  PCP: Marcine MatarJohnson, Deborah B, MD [facility] Patient coming from: home  Chief Complaint: Nausea vomiting and diarrhea x 2 days  HPI: Jesus Phillips is a 35 y.o. male with history h/o HTN , GERD,  polysubstance abuse , gout  presents today with complaints of several episodes of nausea vomiting and diarrhea over the last 2 days.  Patient states his symptoms started on Tuesday but he got himself to go to work yesterday in spite of not feeling well.  He states symptoms worsened yesterday in terms of diarrhea-had multiple episodes-states cannot count, loose watery nonbloody stools every time he tried to eat or take liquids by mouth.  This was associated with transient left upper quadrant pain, 5/10 which he believes was precipitated by a vomiting episode.  Denies any right-sided abdominal pain or flank pain or hematuria or hematochezia or melena.  Patient takes PPI daily for history of GERD.  He admits to ongoing alcohol and marijuana use-last use was on Tuesday.  He states he can go without alcohol for up to 2 weeks without any symptoms of withdrawal.  Never been hospitalized or needed treatment for alcohol withdrawal.  He denies any sick contacts but believes his symptoms were precipitated since he started taking colchicine/allopurinol 3 days prior.  Patient states he was diagnosed with gout many years back (he believes he had arthrocentesis done at some point) and since then has had several flares including his toes and sometimes the left knee joint.  He was advised sometime last year to take allopurinol as well as colchicine daily but he reports taking both these medications only when he has a flare.  He states he does not like to take medications and is already overburdened by his GERD and hypertension medications.  He reports taking several doses of colchicine in the last 3 days along with allopurinol daily for left knee pain/swelling which is now resolved. ER  work-up:Afebrile, uncontrolled blood pressure with systolic 160s and diastolic 117.  Tachycardic with heart rate up to 130s on presentation-now improved to 70s.  Respiratory rate and O2 sat within normal limits.  Labs show WBC 13.7, hemoglobin 18.5, hematocrit 55.6.  Sodium 136, potassium 4.0, chloride 100, BUN 23, creatinine 2.23 (baseline 1.1) with bicarb 20.  Lipase 48.  AST 84, ALT 47, total bili 1.4.  Lactate 2.0.  UA unremarkable.   Review of Systems: As per HPI, otherwise review of systems negative.    Past Medical History:  Diagnosis Date   Gout   HTN, GERD, polysubstance abuse  History reviewed. No pertinent surgical history.  Social history:  reports that he has been smoking cigarettes. He has been smoking an average of .5 packs per day. He has never used smokeless tobacco. He reports current alcohol use. He reports current drug use. Drug: Marijuana.   No Known Allergies  States his parents are separated and he is not aware of health history of his father or stepbrothers/stepsisters.  Reports history of hypertension in mother.     Prior to Admission medications   Medication Sig Start Date End Date Taking? Authorizing Provider  allopurinol (ZYLOPRIM) 100 MG tablet Take 2 tablets (200 mg total) by mouth daily. 06/28/21   Marcine MatarJohnson, Deborah B, MD  amLODipine (NORVASC) 5 MG tablet Take 1 tablet (5 mg total) by mouth daily. 06/28/21   Marcine MatarJohnson, Deborah B, MD  colchicine 0.6 MG tablet Take 1 tablet (0.6 mg total) by mouth daily. 06/28/21  Ladell Pier, MD  omeprazole (PRILOSEC) 20 MG capsule TAKE 1 CAPSULE (20 MG TOTAL) BY MOUTH DAILY. FOR ACID REFLUX 10/01/21   Ladell Pier, MD    Physical Exam: Vitals:   10/18/21 1500 10/18/21 1600 10/18/21 1624 10/18/21 1630  BP: (!) 156/119 (!) 169/117 (!) 169/117   Pulse: (!) 102 86  75  Resp: (!) 23 13  18   Temp:      TempSrc:      SpO2: 100% 100%  100%  Weight:      Height:        Constitutional: NAD, calm,  comfortable Eyes: PERRL, lids and conjunctivae normal ENMT: Mucous membranes are moist. Posterior pharynx clear of any exudate or lesions.Normal dentition.  Neck: normal, supple, no masses, no thyromegaly Respiratory: clear to auscultation bilaterally, no wheezing, no crackles. Normal respiratory effort. No accessory muscle use.  Cardiovascular: Regular rate and rhythm, no murmurs / rubs / gallops. No extremity edema. 2+ pedal pulses. No carotid bruits.  Abdomen: no tenderness appreciated on exam currently, Murphy sign negative. No hepatosplenomegaly. Bowel sounds positive.  Musculoskeletal: no clubbing / cyanosis. No joint deformity upper and lower extremities. Good ROM, no contractures. Normal muscle tone.  Neurologic: CN 2-12 grossly intact. Sensation intact, DTR normal. Strength 5/5 in all 4.  Psychiatric: Normal judgment and insight. Alert and oriented x 3. Normal mood.  SKIN/catheters: no rashes, lesions, ulcers. No induration  Labs on Admission: I have personally reviewed following labs and imaging studies  CBC: Recent Labs  Lab 10/18/21 1106  WBC 13.7*  HGB 18.5*  HCT 55.6*  MCV 92.8  PLT [id number]*   Basic Metabolic Panel: Recent Labs  Lab 10/18/21 1106  NA 136  K 4.0  CL 100  CO2 20*  GLUCOSE 138*  BUN 23*  CREATININE 2.23*  CALCIUM 10.7*   GFR: Estimated Creatinine Clearance: 60.4 mL/min (A) (by C-G formula based on SCr of 2.23 mg/dL (H)). Recent Labs  Lab 10/18/21 1106 10/18/21 1400  WBC 13.7*  --   LATICACIDVEN  --  2.0*   Liver Function Tests: Recent Labs  Lab 10/18/21 1106  AST 84*  ALT 47*  ALKPHOS 141*  BILITOT 1.4*  PROT 9.7*  ALBUMIN 5.7*   Recent Labs  Lab 10/18/21 1106  LIPASE 48   No results for input(s): AMMONIA in the last 168 hours. Coagulation Profile: No results for input(s): INR, PROTIME in the last 168 hours. Cardiac Enzymes: No results for input(s): CKTOTAL, CKMB, CKMBINDEX, TROPONINI in the last 168 hours. BNP (last 3  results) No results for input(s): PROBNP in the last 8760 hours. HbA1C: No results for input(s): HGBA1C in the last 72 hours. CBG: No results for input(s): GLUCAP in the last 168 hours. Lipid Profile: No results for input(s): CHOL, HDL, LDLCALC, TRIG, CHOLHDL, LDLDIRECT in the last 72 hours. Thyroid Function Tests: No results for input(s): TSH, T4TOTAL, FREET4, T3FREE, THYROIDAB in the last 72 hours. Anemia Panel: No results for input(s): VITAMINB12, FOLATE, FERRITIN, TIBC, IRON, RETICCTPCT in the last 72 hours. Urine analysis: No results found for: COLORURINE, APPEARANCEUR, LABSPEC, PHURINE, GLUCOSEU, HGBUR, BILIRUBINUR, KETONESUR, PROTEINUR, UROBILINOGEN, NITRITE, LEUKOCYTESUR  Radiological Exams on Admission: Personally reviewed  No results found.  EKG: Independently reviewed.  Sinus tachycardia with nonspecific ST-T changes in lateral leads--likely rate related     Assessment and Plan:   Principal Problem:   AKI (acute kidney injury) ([place]) Active Problems:   LFT elevation   History of gout   Alcohol use disorder,  mild, abuse   Tobacco dependence   Essential hypertension   Overweight (BMI 25.0-29.9)   Gastroesophageal reflux disease without esophagitis    1.  Intractable nausea vomiting, diarrhea: Likely multifactorial secondary to colchicine overuse/GI intolerance in the setting of ongoing alcohol and marijuana abuse.  Supportive management with IV PPI, IV fluids, clear liquid diet and advance as tolerated.  Antiemetics and antidiarrheals as needed available.  Leukocytosis likely reactive.  Repeat labs in AM.  Follow-up COVID screen although unlikely.  He is up-to-date with vaccination schedule.  2.  Acute kidney injury stage II: Likely prerenal in the setting of GI losses.  Also noted to have uncontrolled blood pressure.  Baseline creatinine in July was 1.1.  Continue IV hydration and repeat labs in AM.  Avoid nephrotoxic medications.  Can consider renal ultrasound if  does not improve with IV hydration  3.  Gouty flares: Patient explained the rationale for allopurinol and colchicine use and gouty flares along with recommended schedules/as needed use.  Given AKI, will hold both these medications currently.  Can likely resume allopurinol daily upon discharge and use colchicine or prednisone for acute flares.  Would avoid NSAIDs.  4.  Hypertension, uncontrolled: In the setting of vomiting and not tolerating p.o. meds.  Received 1 dose of Norvasc in the ED.  Will resume home meds and have as needed hydralazine available.  Watch for any alcohol withdrawal symptoms  5.  Abnormal LFTs: Likely alcohol hepatitis versus cholestasis in the setting of problem 1.  We will check liver ultrasonogram and hepatitis panel.  6. GERD: Likely symptoms precipitated by ongoing alcohol and marijuana abuse.  Resume PPI, will give IV while here  7.  Metabolic acidosis and lactic acid elevation: Likely secondary to GI losses, dehydration and AKI.  Repeat labs after hydration in AM.  8. Polysubstance abuse: Advised the risks of marijuana abuse including substance-induced mood disorder and hyperemesis syndrome.  Patient requesting nicotine patch while here.  He does not have any signs of alcohol withdrawal and appears calm.  Last drink was 2 days back.   DVT prophylaxis: Lovenox  COVID screen: Pending  Code Status: Full code. Patient/Family Communication: Discussed with patient and girlfriend at bedside, all questions answered to satisfaction.  Consults called: None Admission status :Patient will be admitted under OBSERVATION status.The patient's presenting symptoms, physical exam findings, and initial radiographic and laboratory data in the context of their medical condition is felt to place them at low risk for further clinical deterioration. Furthermore, it is anticipated that the patient will be medically stable for discharge from the hospital within 2 midnights of hospital stay.   Patient eager to eat and feels his symptoms are improving.  Anticipate rapid improvement in AKI with IV fluids.  Can consider upgrade in status if symptoms or abnormal labs persistent in a.m.     Alessandra Bevels MD [facility] Pager in [place]  If 7PM-7AM, please contact night-coverage [email or web address]   10/18/2021, 4:52 PM

## 2021-10-18 NOTE — ED Notes (Signed)
Lactic Acid 2.0 RN and provider notified

## 2021-10-18 NOTE — ED Notes (Signed)
ED TO INPATIENT HANDOFF REPORT  ED Nurse Name and Phone #: Delice Bison, RN [phone]  S Name/Age/Gender Jesus Phillips 35 y.o. male Room/Bed: 024C/024C  Code Status   Code Status: Full Code  Home/SNF/Other Home Patient oriented to: self, place, time, and situation Is this baseline? Yes   Triage Complete: Triage complete  Chief Complaint AKI (acute kidney injury) ([facility]) [N17.9]  Triage Note C/o nausea vomiting and diarrhea onset several days ago   Allergies No Known Allergies  Level of Care/Admitting Diagnosis ED Disposition     ED Disposition  Admit   Condition  --   Comment  Hospital Area: [facility] [[id number]]  Level of Care: Med-Surg [16]  May place patient in observation at [facility] or [facility] if equivalent level of care is available:: Yes  Covid Evaluation: Asymptomatic Screening Protocol (No Symptoms)  Diagnosis: AKI (acute kidney injury) [facility]) [[id number]]  Admitting Physician: Alessandra Bevels [[id number]]  Attending Physician: Alessandra Bevels [[id number]]          B Medical/Surgery History Past Medical History:  Diagnosis Date   Gout    History reviewed. No pertinent surgical history.   A IV Location/Drains/Wounds Patient Lines/Drains/Airways Status     Active Line/Drains/Airways     Name Placement date Placement time Site Days   Peripheral IV 10/18/21 20 G 1" Anterior;Right Wrist 10/18/21  1318  Wrist  less than 1            Intake/Output Last 24 hours  Intake/Output Summary (Last 24 hours) at 10/18/2021 1854 Last data filed at 10/18/2021 1553 Gross per 24 hour  Intake 2000 ml  Output --  Net 2000 ml    Labs/Imaging Results for orders placed or performed during the hospital encounter of 10/18/21 (from the past 48 hour(s))  Lipase, blood     Status: None   Collection Time: 10/18/21 11:06 AM  Result Value Ref Range   Lipase 48 11 - 51 U/L    Comment: Performed at [facility], [address] [place], [place]  [address]  Comprehensive metabolic panel     Status: Abnormal   Collection Time: 10/18/21 11:06 AM  Result Value Ref Range   Sodium 136 135 - 145 mmol/L   Potassium 4.0 3.5 - 5.1 mmol/L   Chloride 100 98 - 111 mmol/L   CO2 20 (L) 22 - 32 mmol/L   Glucose, Bld 138 (H) 70 - 99 mg/dL    Comment: Glucose reference range applies only to samples taken after fasting for at least 8 hours.   BUN 23 (H) 6 - 20 mg/dL   Creatinine, Ser 6.76 (H) 0.61 - 1.24 mg/dL   Calcium 19.5 (H) 8.9 - 10.3 mg/dL   Total Protein 9.7 (H) 6.5 - 8.1 g/dL   Albumin 5.7 (H) 3.5 - 5.0 g/dL   AST 84 (H) 15 - 41 U/L   ALT 47 (H) 0 - 44 U/L   Alkaline Phosphatase 141 (H) 38 - 126 U/L   Total Bilirubin 1.4 (H) 0.3 - 1.2 mg/dL   GFR, Estimated 39 (L) >60 mL/min    Comment: (NOTE) Calculated using the CKD-EPI Creatinine Equation (2021)    Anion gap 16 (H) 5 - 15    Comment: Performed at [facility], [address], [place], [place] [address]  CBC     Status: Abnormal   Collection Time: 10/18/21 11:06 AM  Result Value Ref Range   WBC 13.7 (H) 4.0 - 10.5 K/uL  RBC 5.99 (H) 4.22 - 5.81 MIL/uL   Hemoglobin 18.5 (H) 13.0 - 17.0 g/dL   HCT 37.8 (H) 58.8 - 50.2 %   MCV 92.8 80.0 - 100.0 fL   MCH 30.9 26.0 - 34.0 pg   MCHC 33.3 30.0 - 36.0 g/dL   RDW 77.4 12.8 - 78.6 %   Platelets 470 (H) 150 - 400 K/uL   nRBC 0.0 0.0 - 0.2 %    Comment: Performed at [facility], [address] [place], [place] [address]  Lactic acid, plasma     Status: Abnormal   Collection Time: 10/18/21  2:00 PM  Result Value Ref Range   Lactic Acid, Venous 2.0 (HH) 0.5 - 1.9 mmol/L    Comment: CRITICAL RESULT CALLED TO, READ BACK BY AND VERIFIED WITH: JACKIE DODD,RN AT 1502 10/18/2021 BY ZBEECH. Performed at [facility], [address], [place], [place] [address]   Urinalysis, Routine w reflex microscopic Urine, Clean Catch     Status: Abnormal   Collection Time: 10/18/21  4:04 PM  Result Value Ref Range   Color, Urine YELLOW  YELLOW   APPearance CLOUDY (A) CLEAR   Specific Gravity, Urine 1.021 1.005 - 1.030   pH 5.0 5.0 - 8.0   Glucose, UA NEGATIVE NEGATIVE mg/dL   Hgb urine dipstick SMALL (A) NEGATIVE   Bilirubin Urine NEGATIVE NEGATIVE   Ketones, ur 5 (A) NEGATIVE mg/dL   Protein, ur 628 (A) NEGATIVE mg/dL   Nitrite NEGATIVE NEGATIVE   Leukocytes,Ua NEGATIVE NEGATIVE   RBC / HPF 0-5 0 - 5 RBC/hpf   WBC, UA 0-5 0 - 5 WBC/hpf   Bacteria, UA RARE (A) NONE SEEN   Squamous Epithelial / LPF 0-5 0 - 5   Mucus PRESENT    Hyaline Casts, UA PRESENT     Comment: Performed at [facility], [address] [place], [place] [address]  Resp Panel by RT-PCR (Flu A&B, Covid) Nasopharyngeal Swab     Status: None   Collection Time: 10/18/21  4:38 PM   Specimen: Nasopharyngeal Swab; Nasopharyngeal(NP) swabs in vial transport medium  Result Value Ref Range   SARS Coronavirus 2 by RT PCR NEGATIVE NEGATIVE    Comment: (NOTE) SARS-CoV-2 target nucleic acids are NOT DETECTED.  The SARS-CoV-2 RNA is generally detectable in upper respiratory specimens during the acute phase of infection. The lowest concentration of SARS-CoV-2 viral copies this assay can detect is 138 copies/mL. A negative result does not preclude SARS-Cov-2 infection and should not be used as the sole basis for treatment or other patient management decisions. A negative result may occur with  improper specimen collection/handling, submission of specimen other than nasopharyngeal swab, presence of viral mutation(s) within the areas targeted by this assay, and inadequate number of viral copies(<138 copies/mL). A negative result must be combined with clinical observations, patient history, and epidemiological information. The expected result is Negative.  Fact Sheet for Patients:  BloggerCourse.com  Fact Sheet for Healthcare Providers:  SeriousBroker.it  This test is no t yet approved or cleared by  the [place] FDA and  has been authorized for detection and/or diagnosis of SARS-CoV-2 by FDA under an Emergency Use Authorization (EUA). This EUA will remain  in effect (meaning this test can be used) for the duration of the COVID-19 declaration under Section 564(b)(1) of the Act, 21 U.S.C.section 360bbb-3(b)(1), unless the authorization is terminated  or revoked sooner.       Influenza A by PCR NEGATIVE  NEGATIVE   Influenza B by PCR NEGATIVE NEGATIVE    Comment: (NOTE) The Xpert Xpress SARS-CoV-2/FLU/RSV plus assay is intended as an aid in the diagnosis of influenza from Nasopharyngeal swab specimens and should not be used as a sole basis for treatment. Nasal washings and aspirates are unacceptable for Xpert Xpress SARS-CoV-2/FLU/RSV testing.  Fact Sheet for Patients: BloggerCourse.com  Fact Sheet for Healthcare Providers: SeriousBroker.it  This test is not yet approved or cleared by the [place] FDA and has been authorized for detection and/or diagnosis of SARS-CoV-2 by FDA under an Emergency Use Authorization (EUA). This EUA will remain in effect (meaning this test can be used) for the duration of the COVID-19 declaration under Section 564(b)(1) of the Act, 21 U.S.C. section 360bbb-3(b)(1), unless the authorization is terminated or revoked.  Performed at [facility], [address] [place], [place] [address]   Ethanol     Status: None   Collection Time: 10/18/21  5:56 PM  Result Value Ref Range   Alcohol, Ethyl (B) <10 <10 mg/dL    Comment: (NOTE) Lowest detectable limit for serum alcohol is 10 mg/dL.  For medical purposes only. Performed at [facility], [address] [place], [place] [address]   CBC     Status: Abnormal   Collection Time: 10/18/21  5:56 PM  Result Value Ref Range   WBC 15.2 (H) 4.0 - 10.5 K/uL   RBC 5.36 4.22 - 5.81 MIL/uL   Hemoglobin 16.4 13.0 - 17.0 g/dL   HCT 26.3  78.5 - 88.5 %   MCV 94.6 80.0 - 100.0 fL   MCH 30.6 26.0 - 34.0 pg   MCHC 32.3 30.0 - 36.0 g/dL   RDW 02.7 74.1 - 28.7 %   Platelets 414 (H) 150 - 400 K/uL   nRBC 0.0 0.0 - 0.2 %    Comment: Performed at [facility], [address], [place], [place] [address]  Creatinine, serum     Status: Abnormal   Collection Time: 10/18/21  5:56 PM  Result Value Ref Range   Creatinine, Ser 1.59 (H) 0.61 - 1.24 mg/dL   GFR, Estimated 58 (L) >60 mL/min    Comment: (NOTE) Calculated using the CKD-EPI Creatinine Equation (2021) Performed at [facility], [address], [place], [place] [address]    US Abdomen Limited RUQ (LIVER/GB)  Result Date: 10/18/2021 CLINICAL DATA:  Abdominal pain EXAM: ULTRASOUND ABDOMEN LIMITED RIGHT UPPER QUADRANT COMPARISON:  None. FINDINGS: Gallbladder: No gallstones or wall thickening visualized. No sonographic Murphy sign noted by sonographer. Common bile duct: Diameter: 0.3 cm, within normal limits Liver: No focal lesion identified. Within normal limits in parenchymal echogenicity. Portal vein is patent on color Doppler imaging with normal direction of blood flow towards the liver. Other: None. IMPRESSION: No sonographic finding to explain the patient's abdominal pain. Electronically Signed   By: Emmaline Kluver M.D.   On: 10/18/2021 18:35    Pending Labs Unresulted Labs (From admission, onward)     Start     Ordered   10/25/21 0500  Creatinine, serum  (enoxaparin (LOVENOX)    CrCl >/= 30 ml/min)  Weekly,   R     Comments: while on enoxaparin therapy    10/18/21 1646   10/19/21 0500  Comprehensive metabolic panel  Tomorrow morning,   R        10/18/21 1646   10/19/21 0500  CBC  Tomorrow morning,   R  10/18/21 1646   10/18/21 1647  Hepatitis panel, acute  Once,   R        10/18/21 1647   10/18/21 1644  HIV Antibody (routine testing w rflx)  (HIV Antibody (Routine testing w reflex) panel)  Once,   R        10/18/21 1646   10/18/21 1638  Urine  rapid drug screen (hosp performed)  ONCE - STAT,   STAT        10/18/21 1638            Vitals/Pain Today's Vitals   10/18/21 1600 10/18/21 1624 10/18/21 1630 10/18/21 1700  BP: (!) 169/117 (!) 169/117  (!) 161/122  Pulse: 86  75 (!) 102  Resp: 13  18 20   Temp:      TempSrc:      SpO2: 100%  100% 100%  Weight:      Height:      PainSc:        Isolation Precautions No active isolations  Medications Medications  pantoprazole (PROTONIX) injection 40 mg (has no administration in time range)  0.9 %  sodium chloride infusion ( Intravenous New Bag/Given 10/18/21 1738)  hydrALAZINE (APRESOLINE) injection 10 mg (has no administration in time range)  enoxaparin (LOVENOX) injection 40 mg (40 mg Subcutaneous Given 10/18/21 1737)  acetaminophen (TYLENOL) tablet 650 mg (has no administration in time range)    Or  acetaminophen (TYLENOL) suppository 650 mg (has no administration in time range)  HYDROcodone-acetaminophen (NORCO/VICODIN) 5-325 MG per tablet 1-2 tablet (has no administration in time range)  promethazine (PHENERGAN) tablet 12.5 mg (has no administration in time range)  nicotine (NICODERM CQ - dosed in mg/24 hours) patch 14 mg (14 mg Transdermal Patch Applied 10/18/21 1843)  amLODipine (NORVASC) tablet 10 mg (has no administration in time range)  loperamide (IMODIUM) capsule 2 mg (has no administration in time range)  thiamine tablet 100 mg (100 mg Oral Given 10/18/21 1842)  ondansetron (ZOFRAN-ODT) disintegrating tablet 4 mg (4 mg Oral Given 10/18/21 1106)  lactated ringers bolus 2,000 mL (0 mLs Intravenous Stopped 10/18/21 1553)  metoCLOPramide (REGLAN) injection 10 mg (10 mg Intravenous Given 10/18/21 1345)  amLODipine (NORVASC) tablet 5 mg (5 mg Oral Given 10/18/21 1624)    Mobility walks Low fall risk   Focused Assessments Cardiac Assessment Handoff:     Does the Patient currently have chest pain? No   R Recommendations: See Admitting Provider Note  Report given  to:   Additional Notes:

## 2021-10-18 NOTE — ED Provider Notes (Signed)
[facility] EMERGENCY DEPARTMENT Provider Note   CSN: [id number] Arrival date & time: 10/18/21  1030     History Chief Complaint  Patient presents with   Emesis   Nausea   Diarrhea    Abhishek BRITTIAN RENALDO is a 35 y.o. male.  Patient is a 35 year old male with a history of gout, hypertension, regular alcohol and marijuana use who is presenting today with 2 days of worsening nausea, vomiting, diarrhea.  Patient reports that his symptoms started on Tuesday.  However over the weekend he started getting some cramps in his legs and felt like his gout was coming on so he did started taking colchicine.  Tuesday he started having nausea and vomiting which became much worse yesterday.  Family member in the room reports that at work last night he just had worsening nausea and vomiting and has been having multiple episodes of diarrhea.  There has not been any hematemesis or melena.  He does drink alcohol usually every day but reports he can go for days without drinking and never has symptoms of withdrawal.  He has had intermittent abdominal cramps but no specific pain at this time.  He is feeling lightheaded, dizzy, general malaise.  He has not taken anything for the nausea except for Zofran that was given to him in the waiting room which did not improve his nausea.  The history is provided by the patient and a significant other.  Emesis Associated symptoms: diarrhea   Diarrhea Associated symptoms: vomiting       Past Medical History:  Diagnosis Date   Gout     Patient Active Problem List   Diagnosis Date Noted   Tobacco dependence 06/28/2021   Essential hypertension 06/28/2021   Overweight (BMI 25.0-29.9) 06/28/2021   Gastroesophageal reflux disease without esophagitis 06/28/2021   History of gout 02/03/2020   Plantar fasciitis 02/03/2020   Alcohol use disorder, mild, abuse 02/03/2020    History reviewed. No pertinent surgical history.     No family history on  file.  Social History   Tobacco Use   Smoking status: Every Day    Packs/day: 0.50    Types: Cigarettes   Smokeless tobacco: Never  Vaping Use   Vaping Use: Never used  Substance Use Topics   Alcohol use: Yes   Drug use: Yes    Types: Marijuana    Home Medications Prior to Admission medications   Medication Sig Start Date End Date Taking? Authorizing Provider  allopurinol (ZYLOPRIM) 100 MG tablet Take 2 tablets (200 mg total) by mouth daily. 06/28/21   Marcine Matar, MD  amLODipine (NORVASC) 5 MG tablet Take 1 tablet (5 mg total) by mouth daily. 06/28/21   Marcine Matar, MD  colchicine 0.6 MG tablet Take 1 tablet (0.6 mg total) by mouth daily. 06/28/21   Marcine Matar, MD  omeprazole (PRILOSEC) 20 MG capsule TAKE 1 CAPSULE (20 MG TOTAL) BY MOUTH DAILY. FOR ACID REFLUX 10/01/21   Marcine Matar, MD    Allergies    Patient has no known allergies.  Review of Systems   Review of Systems  Gastrointestinal:  Positive for diarrhea and vomiting.  All other systems reviewed and are negative.  Physical Exam Updated Vital Signs BP (!) 144/116 (BP Location: Right Arm)   Pulse (!) 137   Temp (!) 97.3 F (36.3 C) (Oral)   Resp 15   Ht 6\' 3"  (1.905 m)   Wt 102.1 kg   SpO2 99%  BMI 28.12 kg/m   Physical Exam Vitals and nursing note reviewed.  Constitutional:      General: He is not in acute distress.    Appearance: He is well-developed.  HENT:     Head: Normocephalic and atraumatic.     Mouth/Throat:     Mouth: Mucous membranes are dry.  Eyes:     Conjunctiva/sclera: Conjunctivae normal.     Pupils: Pupils are equal, round, and reactive to light.  Cardiovascular:     Rate and Rhythm: Regular rhythm. Tachycardia present.     Pulses: Normal pulses.     Heart sounds: No murmur heard. Pulmonary:     Effort: Pulmonary effort is normal. No respiratory distress.     Breath sounds: Normal breath sounds. No wheezing or rales.  Abdominal:     General: There  is no distension.     Palpations: Abdomen is soft.     Tenderness: There is no abdominal tenderness. There is no guarding or rebound.  Musculoskeletal:        General: No tenderness. Normal range of motion.     Cervical back: Normal range of motion and neck supple.     Right lower leg: No edema.     Left lower leg: No edema.  Skin:    General: Skin is warm and dry.     Findings: No erythema or rash.  Neurological:     Mental Status: He is alert and oriented to person, place, and time. Mental status is at baseline.  Psychiatric:        Mood and Affect: Mood normal.        Behavior: Behavior normal.    ED Results / Procedures / Treatments   Labs (all labs ordered are listed, but only abnormal results are displayed) Labs Reviewed  COMPREHENSIVE METABOLIC PANEL - Abnormal; Notable for the following components:      Result Value   CO2 20 (*)    Glucose, Bld 138 (*)    BUN 23 (*)    Creatinine, Ser 2.23 (*)    Calcium 10.7 (*)    Total Protein 9.7 (*)    Albumin 5.7 (*)    AST 84 (*)    ALT 47 (*)    Alkaline Phosphatase 141 (*)    Total Bilirubin 1.4 (*)    GFR, Estimated 39 (*)    Anion gap 16 (*)    All other components within normal limits  CBC - Abnormal; Notable for the following components:   WBC 13.7 (*)    RBC 5.99 (*)    Hemoglobin 18.5 (*)    HCT 55.6 (*)    Platelets 470 (*)    All other components within normal limits  LACTIC ACID, PLASMA - Abnormal; Notable for the following components:   Lactic Acid, Venous 2.0 (*)    All other components within normal limits  LIPASE, BLOOD  URINALYSIS, ROUTINE W REFLEX MICROSCOPIC    EKG EKG Interpretation  Date/Time:  Thursday October 18 2021 13:51:25 EDT Ventricular Rate:  100 PR Interval:  151 QRS Duration: 86 QT Interval:  351 QTC Calculation: 453 R Axis:   52 Text Interpretation: Sinus tachycardia Right atrial enlargement Abnormal T, consider ischemia, lateral leads No previous tracing Confirmed by  Gwyneth Sprout ([id number]) on 10/18/2021 2:09:34 PM  Radiology No results found.  Procedures Procedures   Medications Ordered in ED Medications  lactated ringers bolus 2,000 mL (has no administration in time range)  metoCLOPramide (REGLAN) injection 10 mg (  has no administration in time range)  ondansetron (ZOFRAN-ODT) disintegrating tablet 4 mg (4 mg Oral Given 10/18/21 1106)    ED Course  I have reviewed the triage vital signs and the nursing notes.  Pertinent labs & imaging results that were available during my care of the patient were reviewed by me and considered in my medical decision making (see chart for details).    MDM Rules/Calculators/A&P                           35 year old male presenting today with persistent nausea vomiting and diarrhea.  This is in the setting of starting colchicine and taking it regularly for the last few days.  He has no localized abdominal pain at this time.  He does not drink alcohol fairly heavily but reports he has not had anything to drink in the last few days but that is not unusual and he usually does not go through symptoms of withdrawal.  He denies any chest pain or shortness of breath but has been feeling dizzy intermittently and continues to complain of cramping in his legs.  He denies any urinary symptoms and has not had any hematemesis.  Labs are concerning for new AKI with creatinine of 2.23 AST of 84, ALT of 47 and total bilirubin of 1.4.  BUN elevated at 23 and anion gap of 16.  Lipase is within normal limits and CBC with leukocytosis of 13 and hemoconcentration with a hemoglobin of 18.  Patient appears dehydrated and concern for dehydration and colchicine as the cause of his AKI.  Low suspicion for an acute abdominal process at this time.  Patient given IV fluids and antiemetics.  EKG and lactate are pending.  3:36 PM Patient's lactic acid is elevated at 2.0.  Heart rate is starting to improve with IV fluids.  Will admit for AKI, ongoing  hydration and ensure return of normal renal function.  MDM   Amount and/or Complexity of Data Reviewed Clinical lab tests: reviewed and ordered Tests in the medicine section of CPT: reviewed and ordered Independent visualization of images, tracings, or specimens: yes  Patient Progress Patient progress: improved   Final Clinical Impression(s) / ED Diagnoses Final diagnoses:  AKI (acute kidney injury) (HCC)  Dehydration    Rx / DC Orders ED Discharge Orders     None        Gwyneth Sprout, MD 10/18/21 1538

## 2021-10-18 NOTE — ED Provider Notes (Signed)
  Physical Exam  BP (!) 169/117   Pulse 86   Temp (!) 97.3 F (36.3 C) (Oral)   Resp 13   Ht 6\' 3"  (1.905 m)   Wt 102.1 kg   SpO2 100%   BMI 28.12 kg/m   Physical Exam  ED Course/Procedures     Procedures  MDM   CC: leg cramps since 1 week. Taking colchicine for it. Also has n/v/d. No abd pain. Now dizzy. Noted to have AKI. Fluids ordered.  Admit.       , MD 10/18/21 1630

## 2021-10-18 NOTE — ED Triage Notes (Signed)
C/o nausea vomiting and diarrhea onset several days ago

## 2021-10-19 ENCOUNTER — Other Ambulatory Visit (HOSPITAL_COMMUNITY): Payer: Self-pay

## 2021-10-19 DIAGNOSIS — N179 Acute kidney failure, unspecified: Secondary | ICD-10-CM | POA: Diagnosis not present

## 2021-10-19 LAB — COMPREHENSIVE METABOLIC PANEL
ALT: 33 U/L (ref 0–44)
AST: 45 U/L — ABNORMAL HIGH (ref 15–41)
Albumin: 4 g/dL (ref 3.5–5.0)
Alkaline Phosphatase: 89 U/L (ref 38–126)
Anion gap: 9 (ref 5–15)
BUN: 19 mg/dL (ref 6–20)
CO2: 20 mmol/L — ABNORMAL LOW (ref 22–32)
Calcium: 9.2 mg/dL (ref 8.9–10.3)
Chloride: 106 mmol/L (ref 98–111)
Creatinine, Ser: 1.21 mg/dL (ref 0.61–1.24)
GFR, Estimated: >60 mL/min (ref >60)
Glucose, Bld: 101 mg/dL — ABNORMAL HIGH (ref 70–99)
Potassium: 3.6 mmol/L (ref 3.5–5.1)
Sodium: 135 mmol/L (ref 135–145)
Total Bilirubin: 1 mg/dL (ref 0.3–1.2)
Total Protein: 6.9 g/dL (ref 6.5–8.1)

## 2021-10-19 LAB — GASTROINTESTINAL PANEL BY PCR, STOOL (REPLACES STOOL CULTURE)
Adenovirus F40/41: NOT DETECTED
Astrovirus: NOT DETECTED
Campylobacter species: NOT DETECTED
Cryptosporidium: NOT DETECTED
Cyclospora cayetanensis: NOT DETECTED
Entamoeba histolytica: NOT DETECTED
Enteroaggregative E coli (EAEC): NOT DETECTED
Enteropathogenic E coli (EPEC): DETECTED — AB
Enterotoxigenic E coli (ETEC): NOT DETECTED
Giardia lamblia: DETECTED — AB
Norovirus GI/GII: NOT DETECTED
Plesimonas shigelloides: NOT DETECTED
Rotavirus A: NOT DETECTED
Salmonella species: NOT DETECTED
Sapovirus (I, II, IV, and V): NOT DETECTED
Shiga like toxin producing E coli (STEC): NOT DETECTED
Shigella/Enteroinvasive E coli (EIEC): NOT DETECTED
Vibrio cholerae: NOT DETECTED
Vibrio species: NOT DETECTED
Yersinia enterocolitica: NOT DETECTED

## 2021-10-19 LAB — CBC
HCT: 43.9 % (ref 39.0–52.0)
Hemoglobin: 14.9 g/dL (ref 13.0–17.0)
MCH: 31.1 pg (ref 26.0–34.0)
MCHC: 33.9 g/dL (ref 30.0–36.0)
MCV: 91.6 fL (ref 80.0–100.0)
Platelets: 382 10*3/uL (ref 150–400)
RBC: 4.79 MIL/uL (ref 4.22–5.81)
RDW: 13.2 % (ref 11.5–15.5)
WBC: 12.3 10*3/uL — ABNORMAL HIGH (ref 4.0–10.5)
nRBC: 0 % (ref 0.0–0.2)

## 2021-10-19 LAB — C DIFFICILE QUICK SCREEN W PCR REFLEX
C Diff antigen: NEGATIVE
C Diff interpretation: NOT DETECTED
C Diff toxin: NEGATIVE

## 2021-10-19 MED ORDER — LOPERAMIDE HCL 2 MG PO CAPS: 2.0000 mg | ORAL_CAPSULE | ORAL | 0 refills | Status: DC | PRN

## 2021-10-19 MED ORDER — NEOMYCIN SULFATE 500 MG PO TABS
1500.0000 mg | ORAL_TABLET | Freq: Once | ORAL | Status: DC
  Filled 2021-10-19: qty 3

## 2021-10-19 MED ORDER — METRONIDAZOLE 250 MG PO TABS
250.0000 mg | ORAL_TABLET | Freq: Three times a day (TID) | ORAL | 0 refills | Status: AC
  Filled 2021-10-19: qty 14, 5d supply, fill #0

## 2021-10-19 MED ORDER — METRONIDAZOLE 500 MG PO TABS
250.0000 mg | ORAL_TABLET | Freq: Once | ORAL | Status: AC
  Administered 2021-10-19: 250 mg via ORAL
  Filled 2021-10-19: qty 1

## 2021-10-19 MED ORDER — AMLODIPINE BESYLATE 10 MG PO TABS: 10.0000 mg | ORAL_TABLET | Freq: Every day | ORAL | 0 refills | Status: DC

## 2021-10-19 MED ORDER — PANTOPRAZOLE SODIUM 40 MG PO TBEC: 40.0000 mg | DELAYED_RELEASE_TABLET | Freq: Every day | ORAL | 0 refills | Status: DC

## 2021-10-19 MED ORDER — NEOMYCIN SULFATE 500 MG PO TABS
1500.0000 mg | ORAL_TABLET | Freq: Three times a day (TID) | ORAL | 0 refills | Status: DC
  Filled 2021-10-19: qty 42, 5d supply, fill #0

## 2021-10-19 MED ORDER — PANTOPRAZOLE SODIUM 40 MG PO TBEC
40.0000 mg | DELAYED_RELEASE_TABLET | Freq: Every day | ORAL | Status: DC
  Administered 2021-10-19: 40 mg via ORAL

## 2021-10-19 NOTE — Progress Notes (Signed)
Jesus Phillips to be discharged Home per MD order. Discussed prescriptions and follow up appointments with the patient. Prescriptions and medication list explained in detail. Patient verbalized understanding.  Skin clean, dry and intact without evidence of skin break down, no evidence of skin tears noted. IV catheter discontinued intact. Site without signs and symptoms of complications. Dressing and pressure applied. Pt denies pain at the site currently. No complaints noted.  Patient free of lines, drains, and wounds.   An After Visit Summary (AVS) was printed and given to the patient. Patient escorted via wheelchair, and discharged home via private auto.  Arvilla Meres, RN

## 2021-10-19 NOTE — Discharge Summary (Addendum)
Physician Discharge Summary  Jesus Phillips ZRA:[id number] DOB: [birth year] DOA: 10/18/2021  PCP: Marcine Matar, MD  Admit date: 10/18/2021 Discharge date: 10/19/2021  Admitted From: Home Disposition: Home  Recommendations for Outpatient Follow-up:  Follow up with PCP in 1 week with repeat BMP Follow up in ED if symptoms worsen or new appear   Home Health: No Equipment/Devices: None  Discharge Condition: Stable CODE STATUS: Full Diet recommendation: Regular  Brief/Interim Summary: 35 y.o. male with history h/o HTN , GERD, gout presented with nausea, vomiting and diarrhea.  On presentation, he was tachycardic.  WBCs of 13.7, creatinine of 2.23 with bicarb of 20.  He was started on IV fluids.  During the hospitalization, his kidney function has normalized.  Nausea and vomiting have improved.  Diarrhea is improving.  Stool studies are pending.  He wants to go home.  He will be discharged home today if he tolerates solid food today.  Discharge Diagnoses:   Intractable nausea, vomiting  -Most likely due to colchicine use along with ongoing alcohol/marijuana use -Improved.  Currently not nauseous or vomiting.  Advance diet to regular solid food today.  If patient tolerates diet, he will be discharged home.    Diarrhea: improving -GI PCR is positive for enteropathogenic E. coli and Giardia lamblia.  We will treat with Flagyl for 5 days upon discharge.  Acute kidney injury on chronic kidney disease stage II acute metabolic acidosis -Treated with IV fluids.  Renal function has normalized.  Recent acute gout flareup -Patient was using allopurinol and colchicine at home.  Knee pain is improving.  Hold colchicine upon discharge.  Continue allopurinol.  Outpatient follow-up with PCP  Hypertension, uncontrolled -Blood pressure still elevated.  Will discharge him on amlodipine 10 mg daily  Abnormal LFTs -Possibly alcohol related.  Right upper quadrant ultrasound was unremarkable.   LFTs improving.  Outpatient follow-up  GERD -Continue PPI on discharge  Substance abuse -Urine drug screen was positive for tetrahydrocannabinol.  Patient was advised by admitting provider regarding excessive marijuana use and hyperemesis syndrome.   Discharge Instructions  Discharge Instructions     Diet general   Complete by: As directed    Increase activity slowly   Complete by: As directed       Allergies as of 10/19/2021   No Known Allergies      Medication List     STOP taking these medications    colchicine 0.6 MG tablet   omeprazole 20 MG capsule Commonly known as: PRILOSEC       TAKE these medications    allopurinol 100 MG tablet Commonly known as: ZYLOPRIM Take 2 tablets (200 mg total) by mouth daily.   amLODipine 10 MG tablet Commonly known as: NORVASC Take 1 tablet (10 mg total) by mouth daily. Start taking on: October 20, 2021 What changed:  medication strength how much to take   loperamide 2 MG capsule Commonly known as: IMODIUM Take 1 capsule (2 mg total) by mouth as needed for diarrhea or loose stools.   metroNIDAZOLE 250 MG tablet Commonly known as: Flagyl Take 1 tablet (250 mg total) by mouth 3 (three) times daily for 14 doses.   pantoprazole 40 MG tablet Commonly known as: PROTONIX Take 1 tablet (40 mg total) by mouth daily. Start taking on: October 20, 2021            Follow-up Information     Marcine Matar, MD. Schedule an appointment as soon as possible for a visit in 1  week(s).   Specialty: Internal Medicine Why: with repeat BMP Contact information: [address] [place] [address] [phone]                No Known Allergies  Consultations: None   Procedures/Studies: US Abdomen Limited RUQ (LIVER/GB)  Result Date: 10/18/2021 CLINICAL DATA:  Abdominal pain EXAM: ULTRASOUND ABDOMEN LIMITED RIGHT UPPER QUADRANT COMPARISON:  None. FINDINGS: Gallbladder: No gallstones or wall thickening  visualized. No sonographic Murphy sign noted by sonographer. Common bile duct: Diameter: 0.3 cm, within normal limits Liver: No focal lesion identified. Within normal limits in parenchymal echogenicity. Portal vein is patent on color Doppler imaging with normal direction of blood flow towards the liver. Other: None. IMPRESSION: No sonographic finding to explain the patient's abdominal pain. Electronically Signed   By: Emmaline Kluver M.D.   On: 10/18/2021 18:35      Subjective: Patient seen and examined at bedside.  States that he is hungry.  Feels okay to go home today if he eats okay.  Nausea and vomiting have improved.  Diarrhea improving.  Discharge Exam: Vitals:   10/19/21 0447 10/19/21 0857  BP: (!) 152/97 (!) 154/105  Pulse: 94 79  Resp: 20 17  Temp: 98.1 F (36.7 C)   SpO2: 98% 100%    General: Pt is alert, awake, not in acute distress Cardiovascular: rate controlled, S1/S2 + Respiratory: bilateral decreased breath sounds at bases Abdominal: Soft, NT, ND, bowel sounds + Extremities: no edema, no cyanosis    The results of significant diagnostics from this hospitalization (including imaging, microbiology, ancillary and laboratory) are listed below for reference.     Microbiology: Recent Results (from the past 240 hour(s))  Resp Panel by RT-PCR (Flu A&B, Covid) Nasopharyngeal Swab     Status: None   Collection Time: 10/18/21  4:38 PM   Specimen: Nasopharyngeal Swab; Nasopharyngeal(NP) swabs in vial transport medium  Result Value Ref Range Status   SARS Coronavirus 2 by RT PCR NEGATIVE NEGATIVE Final    Comment: (NOTE) SARS-CoV-2 target nucleic acids are NOT DETECTED.  The SARS-CoV-2 RNA is generally detectable in upper respiratory specimens during the acute phase of infection. The lowest concentration of SARS-CoV-2 viral copies this assay can detect is 138 copies/mL. A negative result does not preclude SARS-Cov-2 infection and should not be used as the sole basis  for treatment or other patient management decisions. A negative result may occur with  improper specimen collection/handling, submission of specimen other than nasopharyngeal swab, presence of viral mutation(s) within the areas targeted by this assay, and inadequate number of viral copies(<138 copies/mL). A negative result must be combined with clinical observations, patient history, and epidemiological information. The expected result is Negative.  Fact Sheet for Patients:  BloggerCourse.com  Fact Sheet for Healthcare Providers:  SeriousBroker.it  This test is no t yet approved or cleared by the [place] FDA and  has been authorized for detection and/or diagnosis of SARS-CoV-2 by FDA under an Emergency Use Authorization (EUA). This EUA will remain  in effect (meaning this test can be used) for the duration of the COVID-19 declaration under Section 564(b)(1) of the Act, 21 U.S.C.section 360bbb-3(b)(1), unless the authorization is terminated  or revoked sooner.       Influenza A by PCR NEGATIVE NEGATIVE Final   Influenza B by PCR NEGATIVE NEGATIVE Final    Comment: (NOTE) The Xpert Xpress SARS-CoV-2/FLU/RSV plus assay is intended as an aid in the diagnosis of influenza from Nasopharyngeal swab specimens and should  not be used as a sole basis for treatment. Nasal washings and aspirates are unacceptable for Xpert Xpress SARS-CoV-2/FLU/RSV testing.  Fact Sheet for Patients: BloggerCourse.com  Fact Sheet for Healthcare Providers: SeriousBroker.it  This test is not yet approved or cleared by the [place] FDA and has been authorized for detection and/or diagnosis of SARS-CoV-2 by FDA under an Emergency Use Authorization (EUA). This EUA will remain in effect (meaning this test can be used) for the duration of the COVID-19 declaration under Section 564(b)(1) of the Act, 21  U.S.C. section 360bbb-3(b)(1), unless the authorization is terminated or revoked.  Performed at [facility], [address] [place], [place] [address]      Labs: BNP (last 3 results) No results for input(s): BNP in the last 8760 hours. Basic Metabolic Panel: Recent Labs  Lab 10/18/21 1106 10/18/21 1756 10/19/21 0453  NA 136  --  135  K 4.0  --  3.6  CL 100  --  106  CO2 20*  --  20*  GLUCOSE 138*  --  101*  BUN 23*  --  19  CREATININE 2.23* 1.59* 1.21  CALCIUM 10.7*  --  9.2   Liver Function Tests: Recent Labs  Lab 10/18/21 1106 10/19/21 0453  AST 84* 45*  ALT 47* 33  ALKPHOS 141* 89  BILITOT 1.4* 1.0  PROT 9.7* 6.9  ALBUMIN 5.7* 4.0   Recent Labs  Lab 10/18/21 1106  LIPASE 48   No results for input(s): AMMONIA in the last 168 hours. CBC: Recent Labs  Lab 10/18/21 1106 10/18/21 1756 10/19/21 0453  WBC 13.7* 15.2* 12.3*  HGB 18.5* 16.4 14.9  HCT 55.6* 50.7 43.9  MCV 92.8 94.6 91.6  PLT 470* 414* 382   Cardiac Enzymes: No results for input(s): CKTOTAL, CKMB, CKMBINDEX, TROPONINI in the last 168 hours. BNP: Invalid input(s): POCBNP CBG: No results for input(s): GLUCAP in the last 168 hours. D-Dimer No results for input(s): DDIMER in the last 72 hours. Hgb A1c No results for input(s): HGBA1C in the last 72 hours. Lipid Profile No results for input(s): CHOL, HDL, LDLCALC, TRIG, CHOLHDL, LDLDIRECT in the last 72 hours. Thyroid function studies No results for input(s): TSH, T4TOTAL, T3FREE, THYROIDAB in the last 72 hours.  Invalid input(s): FREET3 Anemia work up No results for input(s): VITAMINB12, FOLATE, FERRITIN, TIBC, IRON, RETICCTPCT in the last 72 hours. Urinalysis    Component Value Date/Time   COLORURINE YELLOW 10/18/2021 1604   APPEARANCEUR CLOUDY (A) 10/18/2021 1604   LABSPEC 1.021 10/18/2021 1604   PHURINE 5.0 10/18/2021 1604   GLUCOSEU NEGATIVE 10/18/2021 1604   HGBUR SMALL (A) 10/18/2021 1604   BILIRUBINUR NEGATIVE  10/18/2021 1604   KETONESUR 5 (A) 10/18/2021 1604   PROTEINUR 100 (A) 10/18/2021 1604   NITRITE NEGATIVE 10/18/2021 1604   LEUKOCYTESUR NEGATIVE 10/18/2021 1604   Sepsis Labs Invalid input(s): PROCALCITONIN,  WBC,  LACTICIDVEN Microbiology Recent Results (from the past 240 hour(s))  Resp Panel by RT-PCR (Flu A&B, Covid) Nasopharyngeal Swab     Status: None   Collection Time: 10/18/21  4:38 PM   Specimen: Nasopharyngeal Swab; Nasopharyngeal(NP) swabs in vial transport medium  Result Value Ref Range Status   SARS Coronavirus 2 by RT PCR NEGATIVE NEGATIVE Final    Comment: (NOTE) SARS-CoV-2 target nucleic acids are NOT DETECTED.  The SARS-CoV-2 RNA is generally detectable in upper respiratory specimens during the acute phase of infection. The lowest concentration of SARS-CoV-2 viral copies this assay can detect is 138  copies/mL. A negative result does not preclude SARS-Cov-2 infection and should not be used as the sole basis for treatment or other patient management decisions. A negative result may occur with  improper specimen collection/handling, submission of specimen other than nasopharyngeal swab, presence of viral mutation(s) within the areas targeted by this assay, and inadequate number of viral copies(<138 copies/mL). A negative result must be combined with clinical observations, patient history, and epidemiological information. The expected result is Negative.  Fact Sheet for Patients:  BloggerCourse.com  Fact Sheet for Healthcare Providers:  SeriousBroker.it  This test is no t yet approved or cleared by the [place] FDA and  has been authorized for detection and/or diagnosis of SARS-CoV-2 by FDA under an Emergency Use Authorization (EUA). This EUA will remain  in effect (meaning this test can be used) for the duration of the COVID-19 declaration under Section 564(b)(1) of the Act, 21 U.S.C.section 360bbb-3(b)(1),  unless the authorization is terminated  or revoked sooner.       Influenza A by PCR NEGATIVE NEGATIVE Final   Influenza B by PCR NEGATIVE NEGATIVE Final    Comment: (NOTE) The Xpert Xpress SARS-CoV-2/FLU/RSV plus assay is intended as an aid in the diagnosis of influenza from Nasopharyngeal swab specimens and should not be used as a sole basis for treatment. Nasal washings and aspirates are unacceptable for Xpert Xpress SARS-CoV-2/FLU/RSV testing.  Fact Sheet for Patients: BloggerCourse.com  Fact Sheet for Healthcare Providers: SeriousBroker.it  This test is not yet approved or cleared by the [place] FDA and has been authorized for detection and/or diagnosis of SARS-CoV-2 by FDA under an Emergency Use Authorization (EUA). This EUA will remain in effect (meaning this test can be used) for the duration of the COVID-19 declaration under Section 564(b)(1) of the Act, 21 U.S.C. section 360bbb-3(b)(1), unless the authorization is terminated or revoked.  Performed at [facility], [address], [place], [place] [address]      Time coordinating discharge: 35 minutes  SIGNED:   Glade Lloyd, MD  [facility] 10/19/2021, 11:04 AM

## 2021-10-19 NOTE — Progress Notes (Signed)
PHARMACIST - PHYSICIAN COMMUNICATION  DR:   Hanley Ben  CONCERNING: IV to Oral Route Change Policy  RECOMMENDATION: This patient is receiving protonix by the intravenous route.  Based on criteria approved by the Pharmacy and Therapeutics Committee, the intravenous medication(s) is/are being converted to the equivalent oral dose form(s).   DESCRIPTION: These criteria include: The patient is eating (either orally or via tube) and/or has been taking other orally administered medications for a least 24 hours The patient has no evidence of active gastrointestinal bleeding or impaired GI absorption (gastrectomy, short bowel, patient on TNA or NPO).  If you have questions about this conversion, please contact the Pharmacy Department  []   [phone] )  ( [phone] []   [phone] )  [facility] [x]   [phone] )  [facility] []   [phone] )  [facility] []   [phone] )  [facility]   Jaydeen Darley A. ( [phone], PharmD, BCPS, FNKF Clinical Pharmacist [place] Please utilize Amion for appropriate phone number to reach the unit pharmacist [facility])  10/19/2021 10:18 AM

## 2021-10-19 NOTE — Progress Notes (Signed)
[facility] night cross cover note:  I was contacted by RN who conveyed that this patient had been admitted earlier in the day for evaluation of new onset watery diarrhea, and inquired if we wanted to check any stool studies.  I subsequently placed order for GI stool panel by PCR, but will defer decision regarding further assessment of stool studies, including that of stool lactoferrin, O&P, C. difficile, to rounding hospitalist.    Newton Pigg, DO Hospitalist

## 2021-10-19 NOTE — Progress Notes (Addendum)
Lab called GI panel result to [facility]. Pt is positive for EPEC and giardia as his cause of diarrhea. D/w Dr. Hanley Ben and we will treat with flagyl and neomycin x5d and rx will be sent to [facility]. We will give first dose before discharge.   Ulyses Southward, PharmD, BCIDP, AAHIVP, CPP Infectious Disease Pharmacist 10/19/2021 1:18 PM

## 2021-10-22 ENCOUNTER — Telehealth: Payer: Self-pay

## 2021-10-22 NOTE — Telephone Encounter (Signed)
noted 

## 2021-10-22 NOTE — Telephone Encounter (Signed)
Transition Care Management Follow-up Telephone Call Date of discharge and from where: 10/19/2021, [facility]  How have you been since you were released from the hospital? He said he is feeling okay. Is currently at work. He explained that he thinks the ecoli infection was due to food that he ate from [employer].  Any questions or concerns? Yes- he said that he always takes 2 medications for his gout.  He is currently taking the allopurinol and understands that he was instructed to stop the colchicine but he feels that he still needs it.   Items Reviewed: Did the pt receive and understand the discharge instructions provided? Yes  Medications obtained and verified? Yes - he said he has all medications except the imodium.  Other? No  Any new allergies since your discharge? No  Dietary orders reviewed? Yes Do you have support at home?  Independent.   Home Care and Equipment/Supplies: Were home health services ordered? no If so, what is the name of the agency? N/a  Has the agency set up a time to come to the patient's home? not applicable Were any new equipment or medical supplies ordered?  No What is the name of the medical supply agency? N/a Were you able to get the supplies/equipment? not applicable Do you have any questions related to the use of the equipment or supplies? No  Functional Questionnaire: (I = Independent and D = Dependent) ADLs: independent  Follow up appointments reviewed:  PCP Hospital f/u appt confirmed? Yes  Scheduled to see Dr Laural Benes  on 1110 @ 0910. Specialist Hospital f/u appt confirmed?  None scheduled at this time   Are transportation arrangements needed? No  If their condition worsens, is the pt aware to call PCP or go to the Emergency Dept.? Yes Was the patient provided with contact information for the PCP's office or ED? Yes Was to pt encouraged to call back with questions or concerns? Yes

## 2021-10-25 ENCOUNTER — Other Ambulatory Visit: Payer: Self-pay

## 2021-10-25 ENCOUNTER — Ambulatory Visit: Payer: PRIVATE HEALTH INSURANCE | Attending: Internal Medicine | Admitting: Internal Medicine

## 2021-10-25 ENCOUNTER — Encounter: Payer: Self-pay | Admitting: Internal Medicine

## 2021-10-25 VITALS — BP 144/97 | HR 102 | Resp 16 | Wt 213.4 lb

## 2021-10-25 DIAGNOSIS — Z8739 Personal history of other diseases of the musculoskeletal system and connective tissue: Secondary | ICD-10-CM

## 2021-10-25 DIAGNOSIS — Z09 Encounter for follow-up examination after completed treatment for conditions other than malignant neoplasm: Secondary | ICD-10-CM | POA: Diagnosis not present

## 2021-10-25 DIAGNOSIS — F1011 Alcohol abuse, in remission: Secondary | ICD-10-CM

## 2021-10-25 DIAGNOSIS — K529 Noninfective gastroenteritis and colitis, unspecified: Secondary | ICD-10-CM | POA: Diagnosis not present

## 2021-10-25 DIAGNOSIS — R7989 Other specified abnormal findings of blood chemistry: Secondary | ICD-10-CM

## 2021-10-25 DIAGNOSIS — I10 Essential (primary) hypertension: Secondary | ICD-10-CM

## 2021-10-25 DIAGNOSIS — Z2821 Immunization not carried out because of patient refusal: Secondary | ICD-10-CM

## 2021-10-25 DIAGNOSIS — K219 Gastro-esophageal reflux disease without esophagitis: Secondary | ICD-10-CM

## 2021-10-25 MED ORDER — VALSARTAN 40 MG PO TABS: 40.0000 mg | ORAL_TABLET | Freq: Every day | ORAL | 6 refills | Status: DC

## 2021-10-25 MED ORDER — AMLODIPINE BESYLATE 10 MG PO TABS: 10.0000 mg | ORAL_TABLET | Freq: Every day | ORAL | 0 refills | Status: DC

## 2021-10-25 MED ORDER — PANTOPRAZOLE SODIUM 40 MG PO TBEC: 40.0000 mg | DELAYED_RELEASE_TABLET | Freq: Every day | ORAL | 5 refills | Status: DC

## 2021-10-25 MED ORDER — COLCHICINE 0.6 MG PO TABS: 0.6000 mg | ORAL_TABLET | Freq: Every day | ORAL | 1 refills | Status: DC

## 2021-10-25 NOTE — Progress Notes (Signed)
Patient ID: Jesus Phillips, male    DOB: 07-05-1986  MRN: [id number]  CC: Transition of care Date of hospitalization: 11/3-03/2021 Date of call from case worker: 10/22/2021  Subjective: Jesus Phillips is a 35 y.o. male who presents for transition of care His concerns today include:  Patient with history of HTN, probable gout, tobacco dependence, ETOH use disorder,   Patient hospitalized with acute gastrointestinal illness presenting as vomiting and diarrhea and dehydration.  Creatinine on admission was 2.23 and he had elevation of LFTs with AST 84/ALT 47/alkaline phosphatase 141/total bili 1.4.  Right upper quadrant ultrasound was unremarkable.  Elevated LFTs thought to be due to EtOH use.  He did have elevation in his WBCs.  Urine drug screen positive for THC.  Stools positive for Giardia and enteropathogenic E. coli.  Patient hydrated.  Told to hold colchicine but continue allopurinol.  He was discharged on a 5-day course of Flagyl.     Today: Patient reports he is doing much better since hospital discharge.  He has completed the Flagyl.  Diarrhea, and vomiting have stopped.  He thinks symptoms were due to the food that he ate at [employer] the day before.  HTN: Blood pressure was elevated while in the hospital.  Norvasc increased to 10 mg daily.  He is taking as prescribed and took it already today.  EtOH use: Patient states he has not drank anything in the past 2 weeks.  He is going to try to remain free of alcohol.  Gout: Uric acid level on last visit was around 12.  Patient was advised that the allopurinol needed to be taken daily and not as needed.  He was told to take the colchicine with it for at least the first 2 to 3 months to prevent gout flare and then after that we can change it to as needed.  However patient tells me that he just started taking the allopurinol consistently since he left the hospital.  He was taking the colchicine only when he felt a flareup of gout coming on  which according to his history seem to have occurred a few times a month.  He states he would only have to take the colchicine for about 2 days to knock it out.  Colchicine was discontinued on recent hospitalization given his dehydration.  GERD: Requests refill on pantoprazole.  This remains an issue for him. Patient Active Problem List   Diagnosis Date Noted   AKI (acute kidney injury) (HCC) 10/18/2021   LFT elevation 10/18/2021   Tobacco dependence 06/28/2021   Essential hypertension 06/28/2021   Overweight (BMI 25.0-29.9) 06/28/2021   Gastroesophageal reflux disease without esophagitis 06/28/2021   History of gout 02/03/2020   Plantar fasciitis 02/03/2020   Alcohol use disorder, mild, abuse 02/03/2020     Current Outpatient Medications on File Prior to Visit  Medication Sig Dispense Refill   allopurinol (ZYLOPRIM) 100 MG tablet Take 2 tablets (200 mg total) by mouth daily. 180 tablet 1   amLODipine (NORVASC) 10 MG tablet Take 1 tablet (10 mg total) by mouth daily. 30 tablet 0   loperamide (IMODIUM) 2 MG capsule Take 1 capsule (2 mg total) by mouth as needed for diarrhea or loose stools. 30 capsule 0   pantoprazole (PROTONIX) 40 MG tablet Take 1 tablet (40 mg total) by mouth daily. 15 tablet 0   No current facility-administered medications on file prior to visit.    No Known Allergies  Social History   Socioeconomic History  Marital status: Single    Spouse name: Not on file   Number of children: Not on file   Years of education: Not on file   Highest education level: Not on file  Occupational History   Not on file  Tobacco Use   Smoking status: Every Day    Packs/day: 0.50    Types: Cigarettes   Smokeless tobacco: Never  Vaping Use   Vaping Use: Never used  Substance and Sexual Activity   Alcohol use: Yes   Drug use: Yes    Types: Marijuana   Sexual activity: Yes    Birth control/protection: Condom  Other Topics Concern   Not on file  Social History Narrative    Not on file   Social Determinants of Health   Financial Resource Strain: Not on file  Food Insecurity: Not on file  Transportation Needs: Not on file  Physical Activity: Not on file  Stress: Not on file  Social Connections: Not on file  Intimate Partner Violence: Not on file    No family history on file.  No past surgical history on file.  ROS: Review of Systems Negative except as stated above  PHYSICAL EXAM: BP (!) 144/97   Pulse (!) 102   Resp 16   Wt 213 lb 6.4 oz (96.8 kg)   SpO2 100%   BMI 26.67 kg/m   Wt Readings from Last 3 Encounters:  10/25/21 213 lb 6.4 oz (96.8 kg)  10/18/21 225 lb 1.4 oz (102.1 kg)  06/28/21 225 lb 3.2 oz (102.2 kg)  Repeat BP 150/90  Physical Exam   General appearance - alert, well appearing, young African-American male and in no distress Mental status - normal mood, behavior, speech, dress, motor activity, and thought processes Mouth - mucous membranes moist, pharynx normal without lesions Chest - clear to auscultation, no wheezes, rales or rhonchi, symmetric air entry Heart - normal rate, regular rhythm, normal S1, S2, no murmurs, rubs, clicks or gallops Extremities - peripheral pulses normal, no pedal edema, no clubbing or cyanosis  CMP Latest Ref Rng & Units 10/19/2021 10/18/2021 10/18/2021  Glucose 70 - 99 mg/dL 161(W) - 960(A)  BUN 6 - 20 mg/dL 19 - 54(U)  Creatinine 0.61 - 1.24 mg/dL 9.81 1.91(Y) 7.82(N)  Sodium 135 - 145 mmol/L 135 - 136  Potassium 3.5 - 5.1 mmol/L 3.6 - 4.0  Chloride 98 - 111 mmol/L 106 - 100  CO2 22 - 32 mmol/L 20(L) - 20(L)  Calcium 8.9 - 10.3 mg/dL 9.2 - 10.7(H)  Total Protein 6.5 - 8.1 g/dL 6.9 - 9.7(H)  Total Bilirubin 0.3 - 1.2 mg/dL 1.0 - 1.4(H)  Alkaline Phos 38 - 126 U/L 89 - 141(H)  AST 15 - 41 U/L 45(H) - 84(H)  ALT 0 - 44 U/L 33 - 47(H)   Lipid Panel     Component Value Date/Time   CHOL 208 (H) 06/28/2021 1122   TRIG 435 (H) 06/28/2021 1122   HDL 61 06/28/2021 1122   CHOLHDL 3.4  06/28/2021 1122   LDLCALC 78 06/28/2021 1122    CBC    Component Value Date/Time   WBC 12.3 (H) 10/19/2021 0453   RBC 4.79 10/19/2021 0453   HGB 14.9 10/19/2021 0453   HGB 13.9 06/28/2021 1122   HCT 43.9 10/19/2021 0453   HCT 42.1 06/28/2021 1122   PLT 382 10/19/2021 0453   PLT 382 06/28/2021 1122   MCV 91.6 10/19/2021 0453   MCV 91 06/28/2021 1122   MCH 31.1 10/19/2021  0453   MCHC 33.9 10/19/2021 0453   RDW 13.2 10/19/2021 0453   RDW 12.3 06/28/2021 1122    ASSESSMENT AND PLAN: 1. Hospital discharge follow-up  2. Acute gastroenteritis Symptoms have resolved.  3. Essential hypertension Not at goal.  I recommend adding Diovan 40 mg daily.  Creatinine on discharge from hospital had normalized.  Follow-up with clinical pharmacist in 2 weeks for repeat blood pressure check.  We should recheck BMP on that visit. - amLODipine (NORVASC) 10 MG tablet; Take 1 tablet (10 mg total) by mouth daily.  Dispense: 30 tablet; Refill: 0 - valsartan (DIOVAN) 40 MG tablet; Take 1 tablet (40 mg total) by mouth daily.  Dispense: 30 tablet; Refill: 6 - Comprehensive metabolic panel  4. History of gout Went over management of gout with him.  Seems as though he has frequent flareups which he wants off by taking colchicine early.  Advised to take the allopurinol daily to help decrease the frequency of flareups but that he also has to take the colchicine daily with it for several months.  On next visit I will plan to recheck his uric acid level but we will also check it today.  Advise if he develops any rash while on allopurinol he needs to stop it and let us know. - colchicine 0.6 MG tablet; Take 1 tablet (0.6 mg total) by mouth daily.  Dispense: 30 tablet; Refill: 1 - Uric Acid  5. Gastroesophageal reflux disease without esophagitis GERD precautions discussed.  Advised to avoid certain foods like spicy foods, tomato-based foods, juices and excessive caffeine.  Advised to eat his last meal at least 2  to 3 hours before laying down at nights and to sleep with his head slightly elevated.    - pantoprazole (PROTONIX) 40 MG tablet; Take 1 tablet (40 mg total) by mouth daily.  Dispense: 30 tablet; Refill: 5  6. Alcohol use disorder, mild, in early remission Commended him on quitting.  Encouraged him to remain free of alcohol or cut back significantly.  Advised that the abnormal liver function tests upon admission to the hospital may have been due to excessive alcohol use.  7. Abnormal LFTs See #6 above.  8. Influenza vaccination declined Recommended.  Patient declined.   Patient was given the opportunity to ask questions.  Patient verbalized understanding of the plan and was able to repeat key elements of the plan.   No orders of the defined types were placed in this encounter.    Requested Prescriptions    No prescriptions requested or ordered in this encounter    No follow-ups on file.  Jonah Blue, MD, FACP

## 2021-10-25 NOTE — Patient Instructions (Signed)
Your blood pressure is not at goal.  Continue amlodipine 10 mg daily.  We have added another blood pressure medication called Diovan 40 mg daily.  Check your blood pressure at least twice a week with goal being 130/80 or lower.  We will have you scheduled to see our clinical pharmacist in 2 weeks for repeat blood pressure check and lab tests.  I would encourage you to remain alcohol free.  Take the allopurinol daily as prescribed.  If you develop any rash on this medication you should stop the medicine and let us know.  Take the colchicine daily.  We will plan to have you continue taking it daily for about 2 to 3 months.

## 2021-10-25 NOTE — Progress Notes (Signed)
Pt states he never got the Imodium  Pt states the hospital d/c his Colchicine

## 2021-10-25 NOTE — Addendum Note (Signed)
Addended by: Jonah Blue B on: 10/25/2021 06:00 PM   Modules accepted: Level of Service

## 2021-10-26 LAB — COMPREHENSIVE METABOLIC PANEL
ALT: 67 IU/L — ABNORMAL HIGH (ref 0–44)
AST: 45 IU/L — ABNORMAL HIGH (ref 0–40)
Albumin/Globulin Ratio: 2.5 — ABNORMAL HIGH (ref 1.2–2.2)
Albumin: 5.3 g/dL — ABNORMAL HIGH (ref 4.0–5.0)
Alkaline Phosphatase: 99 IU/L (ref 44–121)
BUN/Creatinine Ratio: 11 (ref 9–20)
BUN: 10 mg/dL (ref 6–20)
Bilirubin Total: 0.2 mg/dL (ref 0.0–1.2)
CO2: 25 mmol/L (ref 20–29)
Calcium: 10 mg/dL (ref 8.7–10.2)
Chloride: 105 mmol/L (ref 96–106)
Creatinine, Ser: 0.88 mg/dL (ref 0.76–1.27)
Globulin, Total: 2.1 g/dL (ref 1.5–4.5)
Glucose: 87 mg/dL (ref 70–99)
Potassium: 4.4 mmol/L (ref 3.5–5.2)
Sodium: 144 mmol/L (ref 134–144)
Total Protein: 7.4 g/dL (ref 6.0–8.5)
eGFR: 116 mL/min/{1.73_m2} (ref >59)

## 2021-10-26 LAB — URIC ACID: Uric Acid: 5.5 mg/dL (ref 3.8–8.4)

## 2021-10-27 ENCOUNTER — Telehealth: Payer: Self-pay | Admitting: Internal Medicine

## 2021-10-27 NOTE — Progress Notes (Signed)
Let patient know that his uric acid level has normalized.  He can stop the daily dose of colchicine and take it only when he has a flareup.  Liver function tests mildly elevated again.  Please return to the lab in 1 to 2 weeks to have levels rechecked.  If still elevated, we may have him stop the allopurinol.  Please try to remain free of excessive alcohol use as discussed on recent visit.  If unable to reach patient via phone, please send him a letter.

## 2021-10-27 NOTE — Telephone Encounter (Signed)
Phone call placed to patient this morning to go over lab results.  I got an automated voicemail.  Mailbox was full and I was unable to leave a message.  I will have my CMA tried to reach him next week with his results.

## 2021-10-29 ENCOUNTER — Telehealth: Payer: Self-pay

## 2021-10-29 NOTE — Telephone Encounter (Signed)
Contacted pt to go over lab results pt is aware and doesn't have any questions or concerns 

## 2021-11-15 ENCOUNTER — Ambulatory Visit: Payer: PRIVATE HEALTH INSURANCE | Admitting: Pharmacist

## 2021-12-17 ENCOUNTER — Other Ambulatory Visit: Payer: Self-pay | Admitting: Internal Medicine

## 2021-12-17 DIAGNOSIS — I10 Essential (primary) hypertension: Secondary | ICD-10-CM

## 2022-02-21 ENCOUNTER — Ambulatory Visit: Payer: PRIVATE HEALTH INSURANCE | Admitting: Internal Medicine

## 2022-03-27 ENCOUNTER — Other Ambulatory Visit: Payer: Self-pay | Admitting: Internal Medicine

## 2022-03-27 DIAGNOSIS — I10 Essential (primary) hypertension: Secondary | ICD-10-CM

## 2022-03-27 NOTE — Telephone Encounter (Signed)
Requested Prescriptions  ?Pending Prescriptions Disp Refills  ?? amLODipine (NORVASC) 10 MG tablet [Pharmacy Med Name: AMLODIPINE BESYLATE 10 MG TAB] 90 tablet 0  ?  Sig: TAKE 1 TABLET BY MOUTH EVERY DAY  ?  ? Cardiovascular: Calcium Channel Blockers 2 Failed - 03/27/2022  2:00 AM  ?  ?  Failed - Last BP in normal range  ?  BP Readings from Last 1 Encounters:  ?10/25/21 (!) 144/97  ?   ?  ?  Passed - Last Heart Rate in normal range  ?  Pulse Readings from Last 1 Encounters:  ?10/25/21 (!) 102  ?   ?  ?  Passed - Valid encounter within last 6 months  ?  Recent Outpatient Visits   ?      ? 5 months ago Hospital discharge follow-up  ? [facility] Marcine Matar, [place]  ? 9 months ago Annual physical exam  ? [facility] Marcine Matar, MD  ? 1 year ago History of gout  ? [facility] [place], [place], [place]  ? 2 years ago History of gout  ? [facility] Jonah Blue B, MD  ? 6 years ago Acute idiopathic gout, unspecified site  ? [facility] [place], [place], [place]  ?  ?  ? ?  ?  ?  ? ? ?

## 2022-04-24 ENCOUNTER — Other Ambulatory Visit: Payer: Self-pay | Admitting: Internal Medicine

## 2022-04-24 DIAGNOSIS — I10 Essential (primary) hypertension: Secondary | ICD-10-CM

## 2022-04-27 ENCOUNTER — Other Ambulatory Visit: Payer: Self-pay | Admitting: Internal Medicine

## 2022-04-27 DIAGNOSIS — I10 Essential (primary) hypertension: Secondary | ICD-10-CM

## 2022-04-29 ENCOUNTER — Ambulatory Visit: Payer: Self-pay

## 2022-04-29 NOTE — Telephone Encounter (Signed)
?  Chief Complaint: med refill ?Symptoms: NA ?Frequency: NA ?Pertinent Negatives: NNA ?Disposition: [] ED /[] Urgent Care (no appt availability in office) / [x] Appointment(In office/virtual)/ []  [place] [facility]/ [] Home Care/ [] Refused Recommended Disposition /[] [place] [facility]/ []  Follow-up with PCP ?Additional Notes: I advised pt the reason for the refusal on meds that were requested. Pt scheduled VV on 05/07/22 at 1110 and also an OV on 08/15/22 at 0910.  ? ?Summary: rx follow up  ? The patient has requested to speak with a member of staff about previously denied requests for valsartan (DIOVAN) 40 MG tablet  and amLODipine (NORVASC) 10 MG tablet  ? ?The patient would like to discuss the denial further when possible  ? ?Please contact further when available   ?  ? ?Reason for Disposition ? [1] Prescription refill request for ESSENTIAL medicine (i.e., likelihood of harm to patient if not taken) AND [2] triager unable to refill per department policy ? ?Answer Assessment - Initial Assessment Questions ?1. DRUG NAME: "What medicine do you need to have refilled?" ?    Valsartan and norvasc ?2. REFILLS REMAINING: "How many refills are remaining?" (Note: The label on the medicine or pill bottle will show how many refills are remaining. If there are no refills remaining, then a renewal may be needed.) ?    0 ?4. PRESCRIBING HCP: "Who prescribed it?" Reason: If prescribed by specialist, call should be referred to that group. ?    Dr. ? ?Protocols used: Medication Refill and Renewal Call-A-AH ? ?

## 2022-04-30 ENCOUNTER — Other Ambulatory Visit: Payer: Self-pay | Admitting: Pharmacist

## 2022-04-30 DIAGNOSIS — I10 Essential (primary) hypertension: Secondary | ICD-10-CM

## 2022-05-07 ENCOUNTER — Telehealth: Payer: PRIVATE HEALTH INSURANCE | Admitting: Nurse Practitioner

## 2022-05-07 ENCOUNTER — Other Ambulatory Visit: Payer: Self-pay | Admitting: Pharmacist

## 2022-05-07 DIAGNOSIS — K219 Gastro-esophageal reflux disease without esophagitis: Secondary | ICD-10-CM

## 2022-05-07 DIAGNOSIS — I10 Essential (primary) hypertension: Secondary | ICD-10-CM

## 2022-05-07 MED ORDER — VALSARTAN 40 MG PO TABS: 40.0000 mg | ORAL_TABLET | Freq: Every day | ORAL | 0 refills | Status: DC

## 2022-05-07 MED ORDER — PANTOPRAZOLE SODIUM 40 MG PO TBEC: 40.0000 mg | DELAYED_RELEASE_TABLET | Freq: Every day | ORAL | 0 refills | Status: DC

## 2022-05-07 MED ORDER — AMLODIPINE BESYLATE 10 MG PO TABS: 10.0000 mg | ORAL_TABLET | Freq: Every day | ORAL | 0 refills | Status: DC

## 2022-05-14 ENCOUNTER — Telehealth: Payer: PRIVATE HEALTH INSURANCE | Admitting: Nurse Practitioner

## 2022-05-19 ENCOUNTER — Other Ambulatory Visit: Payer: Self-pay | Admitting: Internal Medicine

## 2022-05-19 DIAGNOSIS — I10 Essential (primary) hypertension: Secondary | ICD-10-CM

## 2022-05-20 NOTE — Telephone Encounter (Signed)
Medication was refilled 05/07/22 by PCP. Refill was for 30, 0 refills. Will refuse this duplicate request.  Requested Prescriptions  Pending Prescriptions Disp Refills  . valsartan (DIOVAN) 40 MG tablet [Pharmacy Med Name: VALSARTAN 40 MG TABLET] 30 tablet 0    Sig: TAKE 1 TABLET BY MOUTH EVERY DAY     Cardiovascular:  Angiotensin Receptor Blockers Failed - 05/19/2022 11:33 AM      Failed - Cr in normal range and within 180 days    Creatinine, Ser  Date Value Ref Range Status  10/25/2021 0.88 0.76 - 1.27 mg/dL Final         Failed - K in normal range and within 180 days    Potassium  Date Value Ref Range Status  10/25/2021 4.4 3.5 - 5.2 mmol/L Final         Failed - Last BP in normal range    BP Readings from Last 1 Encounters:  10/25/21 (!) 144/97         Failed - Valid encounter within last 6 months    Recent Outpatient Visits          6 months ago Hospital discharge follow-up   [place], [place]   10 months ago Annual physical exam   [place], Deborah B, [place]   1 year ago History of gout   [place], [place]   2 years ago History of gout   [place], Deborah B, MD   6 years ago Acute idiopathic gout, unspecified site   [place], [place], [place]      Future Appointments            In 2 weeks Thereasa Solo, Dionne Bucy, PA-C [place]   In 2 months Wynetta Emery, Dalbert Batman, MD [place] - Patient is not pregnant

## 2022-06-05 ENCOUNTER — Ambulatory Visit: Payer: PRIVATE HEALTH INSURANCE | Admitting: Physician Assistant

## 2022-06-06 ENCOUNTER — Other Ambulatory Visit: Payer: Self-pay | Admitting: Internal Medicine

## 2022-06-06 DIAGNOSIS — K219 Gastro-esophageal reflux disease without esophagitis: Secondary | ICD-10-CM

## 2022-06-06 NOTE — Telephone Encounter (Signed)
Requested Prescriptions  Pending Prescriptions Disp Refills  . pantoprazole (PROTONIX) 40 MG tablet [Pharmacy Med Name: PANTOPRAZOLE SOD DR 40 MG TAB] 90 tablet 0    Sig: TAKE 1 TABLET BY MOUTH EVERY DAY     Gastroenterology: Proton Pump Inhibitors Passed - 06/06/2022  9:32 AM      Passed - Valid encounter within last 12 months    Recent Outpatient Visits          7 months ago Hospital discharge follow-up   [facility] Marcine Matar, [place]   11 months ago Annual physical exam   [facility] Marcine Matar, MD   1 year ago History of gout   [facility] [place], [place], [place]   2 years ago History of gout    [facility] Marcine Matar, MD   6 years ago Acute idiopathic gout, unspecified site   [facility] [place], [place], [place]      Future Appointments            In 2 months Laural Benes, Binnie Rail, MD [facility]

## 2022-06-25 ENCOUNTER — Other Ambulatory Visit: Payer: Self-pay | Admitting: Internal Medicine

## 2022-06-25 DIAGNOSIS — I10 Essential (primary) hypertension: Secondary | ICD-10-CM

## 2022-08-07 ENCOUNTER — Other Ambulatory Visit: Payer: Self-pay

## 2022-08-07 NOTE — Progress Notes (Signed)
Patient appearing on report for [employer] - Hypertension Control report due to last documented ambulatory blood pressure of 144/97 on 10/25/2021. Next appointment with PCP is 08/15/2022.   Outreached patient to discuss hypertension control and medication management.   Current antihypertensives: amlodipine 10 mg daily, valsartan 40 mg daily   Patient does not have an automated upper arm home BP machine. States he was instructed to obtain one but has not done so yet.   He does report adherence to medications although he does not recall the names.   Patient denies side effects related to BP medications   Assessment/Plan: - Currently uncontrolled based on last OV BP - Reviewed goal blood pressure <130/80 - Reviewed appropriate administration of medication regimen - Reminded patient to take all BP medications before his appointment next week.   Valeda Malm, Pharm.D. PGY-2 Ambulatory Care Pharmacy Resident 08/07/2022 11:33 AM

## 2022-08-15 ENCOUNTER — Ambulatory Visit: Payer: PRIVATE HEALTH INSURANCE | Attending: Internal Medicine | Admitting: Internal Medicine

## 2022-08-15 ENCOUNTER — Encounter: Payer: Self-pay | Admitting: Internal Medicine

## 2022-08-15 VITALS — BP 159/94 | HR 115 | Temp 98.1°F | Ht 75.0 in | Wt 206.4 lb

## 2022-08-15 DIAGNOSIS — F17200 Nicotine dependence, unspecified, uncomplicated: Secondary | ICD-10-CM

## 2022-08-15 DIAGNOSIS — R7989 Other specified abnormal findings of blood chemistry: Secondary | ICD-10-CM

## 2022-08-15 DIAGNOSIS — K219 Gastro-esophageal reflux disease without esophagitis: Secondary | ICD-10-CM

## 2022-08-15 DIAGNOSIS — F1010 Alcohol abuse, uncomplicated: Secondary | ICD-10-CM | POA: Diagnosis not present

## 2022-08-15 DIAGNOSIS — I10 Essential (primary) hypertension: Secondary | ICD-10-CM

## 2022-08-15 DIAGNOSIS — Z2821 Immunization not carried out because of patient refusal: Secondary | ICD-10-CM

## 2022-08-15 DIAGNOSIS — M109 Gout, unspecified: Secondary | ICD-10-CM

## 2022-08-15 MED ORDER — PANTOPRAZOLE SODIUM 40 MG PO TBEC: 40.0000 mg | DELAYED_RELEASE_TABLET | Freq: Every day | ORAL | 1 refills | Status: DC

## 2022-08-15 MED ORDER — VALSARTAN 80 MG PO TABS: 80.0000 mg | ORAL_TABLET | Freq: Every day | ORAL | 6 refills | Status: DC

## 2022-08-15 MED ORDER — AMLODIPINE BESYLATE 10 MG PO TABS: 10.0000 mg | ORAL_TABLET | Freq: Every day | ORAL | 6 refills | Status: DC

## 2022-08-15 MED ORDER — COLCHICINE 0.6 MG PO TABS: ORAL_TABLET | ORAL | 1 refills | Status: DC

## 2022-08-15 MED ORDER — ALLOPURINOL 100 MG PO TABS: 200.0000 mg | ORAL_TABLET | Freq: Every day | ORAL | 1 refills | Status: DC

## 2022-08-15 NOTE — Patient Instructions (Signed)
Your blood pressure is not at goal.  Goal is 130/80 or lower. We have increased the valsartan from 40 mg daily to 80 mg daily.  Continue amlodipine.

## 2022-08-15 NOTE — Progress Notes (Signed)
Patient ID: Jesus Phillips, male    DOB: [birth year]  MRN: [id number]  CC: Hypertension   Subjective: Jesus Phillips is a 36 y.o. male who presents for chronic ds management His concerns today include:  Patient with history of HTN, probable gout, tobacco dependence, ETOH use disorder,   HTN: Patient should be on Norvasc 10 mg and Diovan 40 mg daily.  Reports compliance with medications but forgets to take a few times if he is rushing to leave in the mornings.  Took his medicines already for the morning. -no device to check BP but plans to purchase one -limits salt in foods.  He has cut back on pork.  EtOH use disorder: On last visit with me in November posthospitalization, patient reports he had been free of alcohol for 2 weeks. Today he reports he drinks a few glass of Tequila every night.  AST and ALT levels were slightly elevated again at 47/67 on last visit.     Gout: On last visit uric acid level was 5.5.  I sent lab result message to have him stop the colchicine.  I wanted him to continue with the allopurinol.   No flare since last visit.  But arch LT foot feels like something trying to start.  Taking Allopurinol every day but out x 4 days.  Has not had taken Colchicine in a while but started it 4 days ago when he felt a flare in LT foot.    Reports he is doing okay on the pantoprazole but admits he sometimes slips up and eats spaghetti and drinks juices which cause flare in his symptoms.  Tobacco dependence: Still smoking but states he has cut back from a pack a day.  1 pack now last several days.  He is trying to quit.  Declines any medications to help him quit.  HM:  declines flu shot.  Patient Active Problem List   Diagnosis Date Noted   AKI (acute kidney injury) (HCC) 10/18/2021   LFT elevation 10/18/2021   Tobacco dependence 06/28/2021   Essential hypertension 06/28/2021   Overweight (BMI 25.0-29.9) 06/28/2021   Gastroesophageal reflux disease without esophagitis  06/28/2021   History of gout 02/03/2020   Plantar fasciitis 02/03/2020   Alcohol use disorder, mild, abuse 02/03/2020     No current outpatient medications on file prior to visit.   No current facility-administered medications on file prior to visit.    No Known Allergies  Social History   Socioeconomic History   Marital status: Single    Spouse name: Not on file   Number of children: Not on file   Years of education: Not on file   Highest education level: Not on file  Occupational History   Not on file  Tobacco Use   Smoking status: Every Day    Packs/day: 0.50    Types: Cigarettes   Smokeless tobacco: Never  Vaping Use   Vaping Use: Never used  Substance and Sexual Activity   Alcohol use: Yes   Drug use: Yes    Types: Marijuana   Sexual activity: Yes    Birth control/protection: Condom  Other Topics Concern   Not on file  Social History Narrative   Not on file   Social Determinants of Health   Financial Resource Strain: Not on file  Food Insecurity: Not on file  Transportation Needs: Not on file  Physical Activity: Not on file  Stress: Not on file  Social Connections: Not on file  Intimate Partner  Violence: Not on file    No family history on file.  No past surgical history on file.  ROS: Review of Systems Negative except as stated above  PHYSICAL EXAM: BP (!) 159/94   Pulse (!) 115   Temp 98.1 F (36.7 C) (Oral)   Ht 6\' 3"  (1.905 m)   Wt 206 lb 6.4 oz (93.6 kg)   SpO2 100%   BMI 25.80 kg/m   Physical Exam  General appearance - alert, well appearing, young African-American male and in no distress Mental status - normal mood, behavior, speech, dress, motor activity, and thought processes Neck - supple, no significant adenopathy Chest - clear to auscultation, no wheezes, rales or rhonchi, symmetric air entry Heart - normal rate, regular rhythm, normal S1, S2, no murmurs, rubs, clicks or gallops Extremities - peripheral pulses normal, no  pedal edema, no clubbing or cyanosis MSK: Left foot: No edema or erythema.  He is flat-footed.  Slight tenderness on palpation of the mid plantar heel     Latest Ref Rng & Units 10/25/2021   10:39 AM 10/19/2021    4:53 AM 10/18/2021    5:56 PM  CMP  Glucose 70 - 99 mg/dL 87  13/02/2021    BUN 6 - 20 mg/dL 10  19    Creatinine 956 - 1.27 mg/dL 3.87  5.64  3.32   Sodium 134 - 144 mmol/L 144  135    Potassium 3.5 - 5.2 mmol/L 4.4  3.6    Chloride 96 - 106 mmol/L 105  106    CO2 20 - 29 mmol/L 25  20    Calcium 8.7 - 10.2 mg/dL 9.51  9.2    Total Protein 6.0 - 8.5 g/dL 7.4  6.9    Total Bilirubin 0.0 - 1.2 mg/dL 0.2  1.0    Alkaline Phos 44 - 121 IU/L 99  89    AST 0 - 40 IU/L 45  45    ALT 0 - 44 IU/L 67  33     Lipid Panel     Component Value Date/Time   CHOL 208 (H) 06/28/2021 1122   TRIG 435 (H) 06/28/2021 1122   HDL 61 06/28/2021 1122   CHOLHDL 3.4 06/28/2021 1122   LDLCALC 78 06/28/2021 1122    CBC    Component Value Date/Time   WBC 12.3 (H) 10/19/2021 0453   RBC 4.79 10/19/2021 0453   HGB 14.9 10/19/2021 0453   HGB 13.9 06/28/2021 1122   HCT 43.9 10/19/2021 0453   HCT 42.1 06/28/2021 1122   PLT 382 10/19/2021 0453   PLT 382 06/28/2021 1122   MCV 91.6 10/19/2021 0453   MCV 91 06/28/2021 1122   MCH 31.1 10/19/2021 0453   MCHC 33.9 10/19/2021 0453   RDW 13.2 10/19/2021 0453   RDW 12.3 06/28/2021 1122    ASSESSMENT AND PLAN: 1. Essential hypertension Not at goal. Continue to limit salt in the foods. Increase Diovan to 80 mg daily.  Continue amlodipine 10 mg daily. - valsartan (DIOVAN) 80 MG tablet; Take 1 tablet (80 mg total) by mouth daily.  Dispense: 40 tablet; Refill: 6 - amLODipine (NORVASC) 10 MG tablet; Take 1 tablet (10 mg total) by mouth daily.  Dispense: 30 tablet; Refill: 6 - Comprehensive metabolic panel  2. Tobacco dependence Advised to quit.  Commended him on cutting back.  He declines any medication to help him quit.  States that he will quit on his  own.  Advised to set a quit  date.  3. Excessive drinking alcohol Encouraged him to cut back so that he does not slip back into more heavy drinking.  He set a goal to drink no more than 1 tequila drink per night.  4. Abnormal LFTs See #3 above.  We will recheck LFTs today.  Hepatitis C screening has been negative. - Hepatic Function Panel  5. Gouty arthritis of foot Refill given on allopurinol.  Refilled colchicine for him to use as needed. - allopurinol (ZYLOPRIM) 100 MG tablet; Take 2 tablets (200 mg total) by mouth daily.  Dispense: 180 tablet; Refill: 1 - colchicine 0.6 MG tablet; Tab 2 tabs at start of gout flare then 1 tab 2 hrs later then 1 tab PO daily for 7 days.  Dispense: 30 tablet; Refill: 1  6. Gastroesophageal reflux disease without esophagitis GERD precautions discussed.  Advised to avoid certain foods like spicy foods, tomato-based foods, juices and excessive caffeine.  Advised to eat his last meal at least 2 to 3 hours before laying down at nights and to sleep with his head slightly elevated.    - pantoprazole (PROTONIX) 40 MG tablet; Take 1 tablet (40 mg total) by mouth daily.  Dispense: 90 tablet; Refill: 1  7. Influenza vaccination declined Recommended.  Patient declined.    Patient was given the opportunity to ask questions.  Patient verbalized understanding of the plan and was able to repeat key elements of the plan.   This documentation was completed using Paediatric nurse.  Any transcriptional errors are unintentional.  Orders Placed This Encounter  Procedures   Hepatic Function Panel   Comprehensive metabolic panel     Requested Prescriptions   Signed Prescriptions Disp Refills   allopurinol (ZYLOPRIM) 100 MG tablet 180 tablet 1    Sig: Take 2 tablets (200 mg total) by mouth daily.   valsartan (DIOVAN) 80 MG tablet 40 tablet 6    Sig: Take 1 tablet (80 mg total) by mouth daily.   amLODipine (NORVASC) 10 MG tablet 30 tablet 6     Sig: Take 1 tablet (10 mg total) by mouth daily.   pantoprazole (PROTONIX) 40 MG tablet 90 tablet 1    Sig: Take 1 tablet (40 mg total) by mouth daily.   colchicine 0.6 MG tablet 30 tablet 1    Sig: Tab 2 tabs at start of gout flare then 1 tab 2 hrs later then 1 tab PO daily for 7 days.    Return in about 4 months (around 12/15/2022).  Jonah Blue, MD, FACP

## 2022-08-15 NOTE — Progress Notes (Signed)
No concerns. Pt would like refills on two medications.

## 2022-08-16 ENCOUNTER — Other Ambulatory Visit: Payer: Self-pay | Admitting: Internal Medicine

## 2022-08-16 ENCOUNTER — Ambulatory Visit: Payer: PRIVATE HEALTH INSURANCE | Attending: Internal Medicine

## 2022-08-17 LAB — COMPREHENSIVE METABOLIC PANEL
ALT: 32 IU/L (ref 0–44)
AST: 53 IU/L — ABNORMAL HIGH (ref 0–40)
Albumin/Globulin Ratio: 2 (ref 1.2–2.2)
Albumin: 5.2 g/dL — ABNORMAL HIGH (ref 4.1–5.1)
Alkaline Phosphatase: 115 IU/L (ref 44–121)
BUN/Creatinine Ratio: 13 (ref 9–20)
BUN: 12 mg/dL (ref 6–20)
Bilirubin Total: 0.2 mg/dL (ref 0.0–1.2)
CO2: 20 mmol/L (ref 20–29)
Calcium: 9.8 mg/dL (ref 8.7–10.2)
Chloride: 101 mmol/L (ref 96–106)
Creatinine, Ser: 0.95 mg/dL (ref 0.76–1.27)
Globulin, Total: 2.6 g/dL (ref 1.5–4.5)
Glucose: 105 mg/dL — ABNORMAL HIGH (ref 70–99)
Potassium: 4.3 mmol/L (ref 3.5–5.2)
Sodium: 139 mmol/L (ref 134–144)
Total Protein: 7.8 g/dL (ref 6.0–8.5)
eGFR: 107 mL/min/{1.73_m2} (ref >59)

## 2022-08-17 LAB — HEPATIC FUNCTION PANEL: Bilirubin, Direct: 0.14 mg/dL (ref 0.00–0.40)

## 2022-09-06 ENCOUNTER — Other Ambulatory Visit: Payer: Self-pay | Admitting: Internal Medicine

## 2022-09-06 DIAGNOSIS — M109 Gout, unspecified: Secondary | ICD-10-CM

## 2022-09-06 NOTE — Telephone Encounter (Signed)
Rx 08/15/22 #30 1RF- too soon Requested Prescriptions  Pending Prescriptions Disp Refills  . colchicine 0.6 MG tablet [Pharmacy Med Name: COLCHICINE 0.6 MG TABLET] 30 tablet 1    Sig: TAB 2 TABS AT START OF GOUT FLARE THEN 1 TAB 2 HRS LATER THEN 1 TAB BY MOUTH DAILY FOR 7 DAYS.     Endocrinology:  Gout Agents - colchicine Failed - 09/06/2022  9:32 AM      Failed - AST in normal range and within 360 days    AST  Date Value Ref Range Status  08/16/2022 53 (H) 0 - 40 IU/L Final         Failed - CBC within normal limits and completed in the last 12 months    WBC  Date Value Ref Range Status  10/19/2021 12.3 (H) 4.0 - 10.5 K/uL Final   RBC  Date Value Ref Range Status  10/19/2021 4.79 4.22 - 5.81 MIL/uL Final   Hemoglobin  Date Value Ref Range Status  10/19/2021 14.9 13.0 - 17.0 g/dL Final  06/28/2021 13.9 13.0 - 17.7 g/dL Final   HCT  Date Value Ref Range Status  10/19/2021 43.9 39.0 - 52.0 % Final   Hematocrit  Date Value Ref Range Status  06/28/2021 42.1 37.5 - 51.0 % Final   MCHC  Date Value Ref Range Status  10/19/2021 33.9 30.0 - 36.0 g/dL Final   [facility]  Date Value Ref Range Status  10/19/2021 31.1 26.0 - 34.0 pg Final   MCV  Date Value Ref Range Status  10/19/2021 91.6 80.0 - 100.0 fL Final  06/28/2021 91 79 - 97 fL Final   No results found for: "PLTCOUNTKUC", "LABPLAT", "POCPLA" RDW  Date Value Ref Range Status  10/19/2021 13.2 11.5 - 15.5 % Final  06/28/2021 12.3 11.6 - 15.4 % Final         Passed - Cr in normal range and within 360 days    Creatinine, Ser  Date Value Ref Range Status  08/16/2022 0.95 0.76 - 1.27 mg/dL Final         Passed - ALT in normal range and within 360 days    ALT  Date Value Ref Range Status  08/16/2022 32 0 - 44 IU/L Final         Passed - Valid encounter within last 12 months    Recent Outpatient Visits          3 weeks ago Essential hypertension   [place], [place]   10  months ago Hospital discharge follow-up   [place] Ladell Pier, MD   1 year ago Annual physical exam   [place] Ladell Pier, MD   1 year ago History of gout   [place], [place]   2 years ago History of gout   [place], Deborah B, MD      Future Appointments            In 3 months Wynetta Emery, Dalbert Batman, MD [place]

## 2022-09-27 ENCOUNTER — Other Ambulatory Visit: Payer: Self-pay | Admitting: Internal Medicine

## 2022-09-27 DIAGNOSIS — I10 Essential (primary) hypertension: Secondary | ICD-10-CM

## 2022-09-27 NOTE — Telephone Encounter (Signed)
Requested medication (s) are due for refill today:yes  Requested medication (s) are on the active medication list:no  Last refill:  06/26/22  Future visit scheduled:yes  Notes to clinic:  Unable to refill per protocol, Valsartan 80 is the medication on current list, not Valsartan 40. Routing for review.     Requested Prescriptions  Pending Prescriptions Disp Refills   valsartan (DIOVAN) 40 MG tablet [Pharmacy Med Name: VALSARTAN 40 MG TABLET] 90 tablet 0    Sig: TAKE 1 TABLET BY MOUTH EVERY DAY     Cardiovascular:  Angiotensin Receptor Blockers Failed - 09/27/2022  1:18 AM      Failed - Last BP in normal range    BP Readings from Last 1 Encounters:  08/15/22 (!) 159/94         Passed - Cr in normal range and within 180 days    Creatinine, Ser  Date Value Ref Range Status  08/16/2022 0.95 0.76 - 1.27 mg/dL Final         Passed - K in normal range and within 180 days    Potassium  Date Value Ref Range Status  08/16/2022 4.3 3.5 - 5.2 mmol/L Final         Passed - Patient is not pregnant      Passed - Valid encounter within last 6 months    Recent Outpatient Visits           1 month ago Essential hypertension   [place], [place]   11 months ago Hospital discharge follow-up   [place] Ladell Pier, MD   1 year ago Annual physical exam   [place] Ladell Pier, MD   1 year ago History of gout   [place], [place]   2 years ago History of gout   [place], Deborah B, MD       Future Appointments             In 2 months Wynetta Emery Dalbert Batman, MD [place]

## 2022-10-17 ENCOUNTER — Other Ambulatory Visit: Payer: Self-pay | Admitting: Internal Medicine

## 2022-10-17 DIAGNOSIS — M109 Gout, unspecified: Secondary | ICD-10-CM

## 2022-11-16 ENCOUNTER — Other Ambulatory Visit: Payer: Self-pay | Admitting: Internal Medicine

## 2022-11-16 DIAGNOSIS — M109 Gout, unspecified: Secondary | ICD-10-CM

## 2022-11-18 NOTE — Telephone Encounter (Signed)
Requested medication (s) are due for refill today:   Yes  Requested medication (s) are on the active medication list:   Yes  Future visit scheduled:   Yes   Last ordered: 10/17/2022 #90, 0 refills  Returned because a 90 day supply and a DX Code being requested.   Requested Prescriptions  Pending Prescriptions Disp Refills   colchicine 0.6 MG tablet [Pharmacy Med Name: COLCHICINE 0.6 MG TABLET] 270 tablet 1    Sig: TAKE 2 TABLETS BY MOUTH AT START OF GOUT FLARE THEN TAKE 1 TABLET 2 HOURS LATER. THEN 1 TAB DAILY FOR 7 DAYS     Endocrinology:  Gout Agents - colchicine Failed - 11/16/2022  1:32 PM      Failed - AST in normal range and within 360 days    AST  Date Value Ref Range Status  08/16/2022 53 (H) 0 - 40 IU/L Final         Failed - CBC within normal limits and completed in the last 12 months    WBC  Date Value Ref Range Status  10/19/2021 12.3 (H) 4.0 - 10.5 K/uL Final   RBC  Date Value Ref Range Status  10/19/2021 4.79 4.22 - 5.81 MIL/uL Final   Hemoglobin  Date Value Ref Range Status  10/19/2021 14.9 13.0 - 17.0 g/dL Final  38/25/0539 76.7 13.0 - 17.7 g/dL Final   HCT  Date Value Ref Range Status  10/19/2021 43.9 39.0 - 52.0 % Final   Hematocrit  Date Value Ref Range Status  06/28/2021 42.1 37.5 - 51.0 % Final   MCHC  Date Value Ref Range Status  10/19/2021 33.9 30.0 - 36.0 g/dL Final   [facility]  Date Value Ref Range Status  10/19/2021 31.1 26.0 - 34.0 pg Final   MCV  Date Value Ref Range Status  10/19/2021 91.6 80.0 - 100.0 fL Final  06/28/2021 91 79 - 97 fL Final   No results found for: "PLTCOUNTKUC", "LABPLAT", "POCPLA" RDW  Date Value Ref Range Status  10/19/2021 13.2 11.5 - 15.5 % Final  06/28/2021 12.3 11.6 - 15.4 % Final         Passed - Cr in normal range and within 360 days    Creatinine, Ser  Date Value Ref Range Status  08/16/2022 0.95 0.76 - 1.27 mg/dL Final         Passed - ALT in normal range and within 360 days    ALT  Date Value  Ref Range Status  08/16/2022 32 0 - 44 IU/L Final         Passed - Valid encounter within last 12 months    Recent Outpatient Visits           3 months ago Essential hypertension   [facility] Marcine Matar, MD   1 year ago Hospital discharge follow-up   [facility] Marcine Matar, MD   1 year ago Annual physical exam   [facility] Marcine Matar, MD   1 year ago History of gout   [facility] [place], [place], [place]   2 years ago History of gout   [facility] Marcine Matar, MD       Future Appointments             In 1 month Laural Benes, Binnie Rail, MD [facility]

## 2022-12-05 ENCOUNTER — Other Ambulatory Visit: Payer: Self-pay | Admitting: Internal Medicine

## 2022-12-05 DIAGNOSIS — I10 Essential (primary) hypertension: Secondary | ICD-10-CM

## 2022-12-23 ENCOUNTER — Ambulatory Visit: Payer: PRIVATE HEALTH INSURANCE | Admitting: Internal Medicine

## 2023-02-17 ENCOUNTER — Other Ambulatory Visit: Payer: Self-pay | Admitting: Internal Medicine

## 2023-02-17 DIAGNOSIS — M109 Gout, unspecified: Secondary | ICD-10-CM

## 2023-02-17 DIAGNOSIS — K219 Gastro-esophageal reflux disease without esophagitis: Secondary | ICD-10-CM

## 2023-02-18 ENCOUNTER — Other Ambulatory Visit: Payer: Self-pay | Admitting: Internal Medicine

## 2023-02-18 DIAGNOSIS — I10 Essential (primary) hypertension: Secondary | ICD-10-CM

## 2023-02-18 NOTE — Telephone Encounter (Signed)
Refilled medication for 30 days until OV can be made. OV needed for additional refills.  Requested Prescriptions  Pending Prescriptions Disp Refills   amLODipine (NORVASC) 10 MG tablet [Pharmacy Med Name: AMLODIPINE BESYLATE 10 MG TAB] 90 tablet 2    Sig: TAKE 1 TABLET BY MOUTH EVERY DAY     Cardiovascular: Calcium Channel Blockers 2 Failed - 02/18/2023  1:33 AM      Failed - Last BP in normal range    BP Readings from Last 1 Encounters:  08/15/22 (!) 159/94         Failed - Last Heart Rate in normal range    Pulse Readings from Last 1 Encounters:  08/15/22 (!) 115         Failed - Valid encounter within last 6 months    Recent Outpatient Visits           6 months ago Essential hypertension   [place] Ladell Pier, MD   1 year ago Hospital discharge follow-up   [place] Ladell Pier, MD   1 year ago Annual physical exam   [place] Ladell Pier, MD   1 year ago History of gout   [place], [place]   3 years ago History of gout   [place] Ladell Pier, MD

## 2023-02-20 NOTE — Telephone Encounter (Signed)
Pt called in to f/u on medication refills. Pt scheduled appointment first availble for 03/21. Pt asked if it was possible to get a courtesy refill to get him through until his appointment.  Please advise.

## 2023-03-06 ENCOUNTER — Ambulatory Visit: Payer: PRIVATE HEALTH INSURANCE | Attending: Physician Assistant | Admitting: Physician Assistant

## 2023-03-06 ENCOUNTER — Encounter: Payer: Self-pay | Admitting: Physician Assistant

## 2023-03-06 VITALS — BP 142/90 | HR 100 | Wt 213.0 lb

## 2023-03-06 DIAGNOSIS — I10 Essential (primary) hypertension: Secondary | ICD-10-CM | POA: Diagnosis not present

## 2023-03-06 DIAGNOSIS — Z13220 Encounter for screening for lipoid disorders: Secondary | ICD-10-CM

## 2023-03-06 DIAGNOSIS — M109 Gout, unspecified: Secondary | ICD-10-CM | POA: Diagnosis not present

## 2023-03-06 DIAGNOSIS — K219 Gastro-esophageal reflux disease without esophagitis: Secondary | ICD-10-CM | POA: Diagnosis not present

## 2023-03-06 MED ORDER — PANTOPRAZOLE SODIUM 40 MG PO TBEC: 40.0000 mg | DELAYED_RELEASE_TABLET | Freq: Every day | ORAL | 1 refills | Status: DC

## 2023-03-06 MED ORDER — ALLOPURINOL 100 MG PO TABS: 200.0000 mg | ORAL_TABLET | Freq: Every day | ORAL | 1 refills | Status: DC

## 2023-03-06 MED ORDER — VALSARTAN 80 MG PO TABS: 80.0000 mg | ORAL_TABLET | Freq: Every day | ORAL | 1 refills | Status: DC

## 2023-03-06 MED ORDER — AMLODIPINE BESYLATE 10 MG PO TABS: 10.0000 mg | ORAL_TABLET | Freq: Every day | ORAL | 1 refills | Status: DC

## 2023-03-06 NOTE — Progress Notes (Signed)
Patient ID: Jesus Phillips, male   DOB: December 31, 1985, 37 y.o.   MRN: [id number]   Jesus Phillips, is a 37 y.o. male  [id number]  [id number]  DOB - 1986/07/03  Chief Complaint  Patient presents with   Medication Refill   Hypertension       Subjective:   Adren Fouse is a 37 y.o. male here today for med RF.  No flares of gout since being on allopurinol.  Works at [employer]    Denies HA/Dizziness/CP/SOB.  Just took BP meds about 1 hour before arrival.  He is fasting this morning  No problems updated.  ALLERGIES: No Known Allergies  PAST MEDICAL HISTORY: Past Medical History:  Diagnosis Date   Gout     MEDICATIONS AT HOME: Prior to Admission medications   Medication Sig Start Date End Date Taking? Authorizing Provider  allopurinol (ZYLOPRIM) 100 MG tablet Take 2 tablets (200 mg total) by mouth daily. 03/06/23   Argentina Donovan, PA-C  amLODipine (NORVASC) 10 MG tablet Take 1 tablet (10 mg total) by mouth daily. 03/06/23   Argentina Donovan, PA-C  colchicine 0.6 MG tablet TAB 2 TABS AT START OF GOUT FLARE THEN 1 TAB 2 HRS LATER THEN 1 TAB BY MOUTH DAILY FOR 7 DAYS. 10/17/22   Ladell Pier, MD  pantoprazole (PROTONIX) 40 MG tablet Take 1 tablet (40 mg total) by mouth daily. 03/06/23   Argentina Donovan, PA-C  valsartan (DIOVAN) 80 MG tablet Take 1 tablet (80 mg total) by mouth daily. 03/06/23   Lavin Petteway, Dionne Bucy, PA-C    ROS: Neg HEENT Neg resp Neg cardiac Neg GI Neg GU Neg MS Neg psych Neg neuro  Objective:   Vitals:   03/06/23 0918 03/06/23 0932  BP: (!) 162/102 (!) 142/90  Pulse: 100   SpO2: 100%   Weight: 213 lb (96.6 kg)    Exam General appearance : Awake, alert, not in any distress. Speech Clear. Not toxic looking HEENT: Atraumatic and Normocephalic Neck: Supple, no JVD. No cervical lymphadenopathy.  Chest: Good air entry bilaterally, CTAB.  No rales/rhonchi/wheezing CVS: S1 S2 regular, no murmurs.  Extremities: B/L Lower Ext shows no edema,  both legs are warm to touch Neurology: Awake alert, and oriented X 3, CN II-XII intact, Non focal Skin: No Rash  Data Review No results found for: "HGBA1C"  Assessment & Plan   1. Essential hypertension Not at goal.  He says he is willing to get a BP cuff. Obtain blood pressure cuff.  Sit still and quiet for 5 mins and take deep breaths.  Check your blood pressure.  Write down the readings.   Goal blood pressure is <130/85.  Call for an appointment is 6 weeks if you are consistently seeing numbers higher than this. - Lipid panel - Comprehensive metabolic panel - CBC with Differential/Platelet - amLODipine (NORVASC) 10 MG tablet; Take 1 tablet (10 mg total) by mouth daily.  Dispense: 90 tablet; Refill: 1 - valsartan (DIOVAN) 80 MG tablet; Take 1 tablet (80 mg total) by mouth daily.  Dispense: 90 tablet; Refill: 1  2. Gastroesophageal reflux disease without esophagitis - CBC with Differential/Platelet - pantoprazole (PROTONIX) 40 MG tablet; Take 1 tablet (40 mg total) by mouth daily.  Dispense: 90 tablet; Refill: 1  3. Gouty arthritis of foot - allopurinol (ZYLOPRIM) 100 MG tablet; Take 2 tablets (200 mg total) by mouth daily.  Dispense: 180 tablet; Refill: 1  4. Screening cholesterol level - Lipid panel - Comprehensive metabolic panel  Return in about 5 months (around 08/06/2023) for see PCP for chronic conditions.  The patient was given clear instructions to go to ER or return to medical center if symptoms don't improve, worsen or new problems develop. The patient verbalized understanding. The patient was told to call to get lab results if they haven't heard anything in the next week.      Freeman Caldron, PA-C [facility], [place]   03/06/2023, 9:34 AM

## 2023-03-06 NOTE — Patient Instructions (Addendum)
Obtain blood pressure cuff.  Sit still and quiet for 5 mins and take deep breaths.  Check your blood pressure.  Write down the readings.   Goal blood pressure is <130/85.  Call for an appointment is 6 weeks if you are consistently seeing numbers higher than this.   Food Choices for Gastroesophageal Reflux Disease, Adult When you have gastroesophageal reflux disease (GERD), the foods you eat and your eating habits are very important. Choosing the right foods can help ease your discomfort. Think about working with a food expert (dietitian) to help you make good choices. What are tips for following this plan? Reading food labels Look for foods that are low in saturated fat. Foods that may help with your symptoms include: Foods that have less than 5% of daily value (DV) of fat. Foods that have 0 grams of trans fat. Cooking Do not fry your food. Cook your food by baking, steaming, grilling, or broiling. These are all methods that do not need a lot of fat for cooking. To add flavor, try to use herbs that are low in spice and acidity. Meal planning  Choose healthy foods that are low in fat, such as: Fruits and vegetables. Whole grains. Low-fat dairy products. Lean meats, fish, and poultry. Eat small meals often instead of eating 3 large meals each day. Eat your meals slowly in a place where you are relaxed. Avoid bending over or lying down until 2-3 hours after eating. Limit high-fat foods such as fatty meats or fried foods. Limit your intake of fatty foods, such as oils, butter, and shortening. Avoid the following as told by your doctor: Foods that cause symptoms. These may be different for different people. Keep a food diary to keep track of foods that cause symptoms. Alcohol. Drinking a lot of liquid with meals. Eating meals during the 2-3 hours before bed. Lifestyle Stay at a healthy weight. Ask your doctor what weight is healthy for you. If you need to lose weight, work with your doctor  to do so safely. Exercise for at least 30 minutes on 5 or more days each week, or as told by your doctor. Wear loose-fitting clothes. Do not smoke or use any products that contain nicotine or tobacco. If you need help quitting, ask your doctor. Sleep with the head of your bed higher than your feet. Use a wedge under the mattress or blocks under the bed frame to raise the head of the bed. Chew sugar-free gum after meals. What foods should eat?  Eat a healthy, well-balanced diet of fruits, vegetables, whole grains, low-fat dairy products, lean meats, fish, and poultry. Each person is different. Foods that may cause symptoms in one person may not cause any symptoms in another person. Work with your doctor to find foods that are safe for you. The items listed above may not be a complete list of what you can eat and drink. Contact a food expert for more options. What foods should I avoid? Limiting some of these foods may help in managing the symptoms of GERD. Everyone is different. Talk with a food expert or your doctor to help you find the exact foods to avoid, if any. Fruits Any fruits prepared with added fat. Any fruits that cause symptoms. For some people, this may include citrus fruits, such as oranges, grapefruit, pineapple, and lemons. Vegetables Deep-fried vegetables. [place] fries. Any vegetables prepared with added fat. Any vegetables that cause symptoms. For some people, this may include tomatoes and tomato products, chili peppers,  onions and garlic, and horseradish. Grains Pastries or quick breads with added fat. Meats and other proteins High-fat meats, such as fatty beef or pork, hot dogs, ribs, ham, sausage, salami, and bacon. Fried meat or protein, including fried fish and fried chicken. Nuts and nut butters, in large amounts. Dairy Whole milk and chocolate milk. Sour cream. Cream. Ice cream. Cream cheese. Milkshakes. Fats and oils Butter. Margarine. Shortening.  Ghee. Beverages Coffee and tea, with or without caffeine. Carbonated beverages. Sodas. Energy drinks. Fruit juice made with acidic fruits, such as orange or grapefruit. Tomato juice. Alcoholic drinks. Sweets and desserts Chocolate and cocoa. Donuts. Seasonings and condiments Pepper. Peppermint and spearmint. Added salt. Any condiments, herbs, or seasonings that cause symptoms. For some people, this may include curry, hot sauce, or vinegar-based salad dressings. The items listed above may not be a complete list of what you should not eat and drink. Contact a food expert for more options. Questions to ask your doctor Diet and lifestyle changes are often the first steps that are taken to manage symptoms of GERD. If diet and lifestyle changes do not help, talk with your doctor about taking medicines. Where to find more information International Foundation for Gastrointestinal Disorders: aboutgerd.org Summary When you have GERD, food and lifestyle choices are very important in easing your symptoms. Eat small meals often instead of 3 large meals a day. Eat your meals slowly and in a place where you are relaxed. Avoid bending over or lying down until 2-3 hours after eating. Limit high-fat foods such as fatty meats or fried foods. This information is not intended to replace advice given to you by your health care provider. Make sure you discuss any questions you have with your health care provider. Document Revised: 06/12/2020 Document Reviewed: 06/12/2020 Elsevier Patient Education  [place].

## 2023-03-07 ENCOUNTER — Other Ambulatory Visit: Payer: Self-pay | Admitting: Physician Assistant

## 2023-03-07 DIAGNOSIS — E781 Pure hyperglyceridemia: Secondary | ICD-10-CM

## 2023-03-07 LAB — CBC WITH DIFFERENTIAL/PLATELET
Basophils Absolute: 0 10*3/uL (ref 0.0–0.2)
Basos: 0 %
EOS (ABSOLUTE): 0 10*3/uL (ref 0.0–0.4)
Eos: 0 %
Hematocrit: 41.8 % (ref 37.5–51.0)
Hemoglobin: 14.5 g/dL (ref 13.0–17.7)
Immature Grans (Abs): 0 10*3/uL (ref 0.0–0.1)
Immature Granulocytes: 0 %
Lymphocytes Absolute: 2.1 10*3/uL (ref 0.7–3.1)
Lymphs: 40 %
MCH: 31 pg (ref 26.6–33.0)
MCHC: 34.7 g/dL (ref 31.5–35.7)
MCV: 89 fL (ref 79–97)
Monocytes Absolute: 0.3 10*3/uL (ref 0.1–0.9)
Monocytes: 7 %
Neutrophils Absolute: 2.7 10*3/uL (ref 1.4–7.0)
Neutrophils: 53 %
Platelets: 392 10*3/uL (ref 150–450)
RBC: 4.68 x10E6/uL (ref 4.14–5.80)
RDW: 12 % (ref 11.6–15.4)
WBC: 5.1 10*3/uL (ref 3.4–10.8)

## 2023-03-07 LAB — COMPREHENSIVE METABOLIC PANEL
ALT: 76 IU/L — ABNORMAL HIGH (ref 0–44)
AST: 94 IU/L — ABNORMAL HIGH (ref 0–40)
Albumin/Globulin Ratio: 2 (ref 1.2–2.2)
Albumin: 5.3 g/dL — ABNORMAL HIGH (ref 4.1–5.1)
Alkaline Phosphatase: 123 IU/L — ABNORMAL HIGH (ref 44–121)
BUN/Creatinine Ratio: 18 (ref 9–20)
BUN: 17 mg/dL (ref 6–20)
Bilirubin Total: 0.4 mg/dL (ref 0.0–1.2)
CO2: 16 mmol/L — ABNORMAL LOW (ref 20–29)
Calcium: 9.9 mg/dL (ref 8.7–10.2)
Chloride: 102 mmol/L (ref 96–106)
Creatinine, Ser: 0.97 mg/dL (ref 0.76–1.27)
Globulin, Total: 2.7 g/dL (ref 1.5–4.5)
Glucose: 106 mg/dL — ABNORMAL HIGH (ref 70–99)
Potassium: 4.2 mmol/L (ref 3.5–5.2)
Sodium: 139 mmol/L (ref 134–144)
Total Protein: 8 g/dL (ref 6.0–8.5)
eGFR: 104 mL/min/{1.73_m2} (ref >59)

## 2023-03-07 LAB — LIPID PANEL
Chol/HDL Ratio: 3.8 ratio (ref 0.0–5.0)
Cholesterol, Total: 223 mg/dL — ABNORMAL HIGH (ref 100–199)
HDL: 59 mg/dL (ref >39)
LDL Chol Calc (NIH): 69 mg/dL (ref 0–99)
Triglycerides: 623 mg/dL (ref 0–149)
VLDL Cholesterol Cal: 95 mg/dL — ABNORMAL HIGH (ref 5–40)

## 2023-03-07 MED ORDER — FENOFIBRATE 145 MG PO TABS: 145.0000 mg | ORAL_TABLET | Freq: Every day | ORAL | 1 refills | Status: DC

## 2023-03-10 ENCOUNTER — Telehealth: Payer: Self-pay

## 2023-03-10 NOTE — Telephone Encounter (Signed)
Pt was called and is aware of results, DOB was confirmed.  ?

## 2023-03-10 NOTE — Telephone Encounter (Signed)
-----   Message from [place] Donovan, [place] sent at 03/07/2023  9:42 AM EDT ----- Please call patient.  His cholesterol is high and triglycerides are VERY high.  I sent a prescription to help with triglycerides.  I also advise low fat/low sugar diet.  Liver function is high-he should avoid drinking any alcohol and avoid products containing tylenol.  Blood count is normal.  Follow up as planned.  Thanks, Freeman Caldron, PA-C

## 2023-08-21 ENCOUNTER — Ambulatory Visit: Payer: PRIVATE HEALTH INSURANCE | Admitting: Internal Medicine

## 2023-08-31 ENCOUNTER — Other Ambulatory Visit: Payer: Self-pay | Admitting: Physician Assistant

## 2023-08-31 DIAGNOSIS — M109 Gout, unspecified: Secondary | ICD-10-CM

## 2023-08-31 DIAGNOSIS — I10 Essential (primary) hypertension: Secondary | ICD-10-CM

## 2023-08-31 DIAGNOSIS — K219 Gastro-esophageal reflux disease without esophagitis: Secondary | ICD-10-CM

## 2023-09-02 NOTE — Telephone Encounter (Signed)
Requested Prescriptions  Pending Prescriptions Disp Refills   valsartan (DIOVAN) 80 MG tablet [Pharmacy Med Name: VALSARTAN 80 MG TABLET] 90 tablet 0    Sig: TAKE 1 TABLET BY MOUTH EVERY DAY     Cardiovascular:  Angiotensin Receptor Blockers Failed - 08/31/2023  9:10 AM      Failed - Last BP in normal range    BP Readings from Last 1 Encounters:  03/06/23 (!) 142/90         Passed - Cr in normal range and within 180 days    Creatinine, Ser  Date Value Ref Range Status  03/06/2023 0.97 0.76 - 1.27 mg/dL Final         Passed - K in normal range and within 180 days    Potassium  Date Value Ref Range Status  03/06/2023 4.2 3.5 - 5.2 mmol/L Final         Passed - Patient is not pregnant      Passed - Valid encounter within last 6 months    Recent Outpatient Visits           6 months ago Screening cholesterol level   [facility] [place], [place], [place]   1 year ago Essential hypertension   [place] [facility] Marcine Matar, [facility]   1 year ago Hospital discharge follow-up   [facility] [place] [facility] Marcine Matar, MD   2 years ago Annual physical exam   [place] [facility] Marcine Matar, MD   2 years ago History of gout   [place] [facility] [place], [place]ella Sch[place], [place]       Future Appointments             In 2 days Sharon Seller, Marzella Schlein, PA-C [facility]             pantoprazole (PROTONIX) 40 MG tablet [Pharmacy Med Name: PANTOPRAZOLE SOD DR 40 MG TAB] 90 tablet 0    Sig: TAKE 1 TABLET BY MOUTH EVERY DAY     Gastroenterology: Proton Pump Inhibitors Passed - 08/31/2023  9:10 AM      Passed - Valid encounter within last 12 months    Recent Outpatient Visits           6 months ago Screening cholesterol level   [facility], [place], [place]   1 year  ago Essential hypertension   [place] [facility] Marcine Matar, MD   1 year ago Hospital discharge follow-up   [facility] Marcine Matar, MD   2 years ago Annual physical exam   [place] [facility] Marcine Matar, MD   2 years ago History of gout    [facility] [place], [place], [place]       Future Appointments             In 2 days Anders Simmonds, PA-C [facility]             allopurinol (ZYLOPRIM) 100 MG tablet [Pharmacy Med Name: ALLOPURINOL 100 MG TABLET] 180 tablet 0    Sig: TAKE 2 TABLETS BY MOUTH EVERY DAY     Endocrinology:  Gout Agents - allopurinol Failed - 08/31/2023  9:10  AM      Failed - Uric Acid in normal range and within 360 days    Uric Acid  Date Value Ref Range Status  10/25/2021 5.5 3.8 - 8.4 mg/dL Final    Comment:               Therapeutic target for gout patients: <6.0         Passed - Cr in normal range and within 360 days    Creatinine, Ser  Date Value Ref Range Status  03/06/2023 0.97 0.76 - 1.27 mg/dL Final         Passed - Valid encounter within last 12 months    Recent Outpatient Visits           6 months ago Screening cholesterol level   [place] [facility] [place], [place], [place]   1 year ago Essential hypertension   [place] [facility] Marcine Matar, MD   1 year ago Hospital discharge follow-up   [place] [facility] Marcine Matar, MD   2 years ago Annual physical exam   [place] [facility] Jonah Blue B, MD   2 years ago History of gout   [place] [facility] [place], [place], [place]       Future Appointments             In 2 days Anders Simmonds, PA-C  [facility]             Passed - CBC within normal limits and completed in the last 12 months    WBC  Date Value Ref Range Status  03/06/2023 5.1 3.4 - 10.8 x10E3/uL Final  10/19/2021 12.3 (H) 4.0 - 10.5 K/uL Final   RBC  Date Value Ref Range Status  03/06/2023 4.68 4.14 - 5.80 x10E6/uL Final  10/19/2021 4.79 4.22 - 5.81 MIL/uL Final   Hemoglobin  Date Value Ref Range Status  03/06/2023 14.5 13.0 - 17.7 g/dL Final   Hematocrit  Date Value Ref Range Status  03/06/2023 41.8 37.5 - 51.0 % Final   MCHC  Date Value Ref Range Status  03/06/2023 34.7 31.5 - 35.7 g/dL Final  16/09/9603 54.0 30.0 - 36.0 g/dL Final   [facility]  Date Value Ref Range Status  03/06/2023 31.0 26.6 - 33.0 pg Final  10/19/2021 31.1 26.0 - 34.0 pg Final   MCV  Date Value Ref Range Status  03/06/2023 89 79 - 97 fL Final   No results found for: "PLTCOUNTKUC", "LABPLAT", "POCPLA" RDW  Date Value Ref Range Status  03/06/2023 12.0 11.6 - 15.4 % Final          amLODipine (NORVASC) 10 MG tablet [Pharmacy Med Name: AMLODIPINE BESYLATE 10 MG TAB] 90 tablet 0    Sig: TAKE 1 TABLET BY MOUTH EVERY DAY     Cardiovascular: Calcium Channel Blockers 2 Failed - 08/31/2023  9:10 AM      Failed - Last BP in normal range    BP Readings from Last 1 Encounters:  03/06/23 (!) 142/90         Passed - Last Heart Rate in normal range    Pulse Readings from Last 1 Encounters:  03/06/23 100         Passed - Valid encounter within last 6 months    Recent Outpatient Visits  6 months ago Screening cholesterol level   [facility] [place], [place], [place]   1 year ago Essential hypertension   [place] [facility] Marcine Matar, MD   1 year ago Hospital discharge follow-up   [facility] Matar, MD   2 years ago Annual physical exam   [facility] Marcine Matar, MD   2 years ago  History of gout   [facility] [place], [place], [place]       Future Appointments             In 2 days Sharon Seller, Marzella Schlein, PA-C [facility]

## 2023-09-04 ENCOUNTER — Ambulatory Visit: Payer: PRIVATE HEALTH INSURANCE | Attending: Internal Medicine | Admitting: Physician Assistant

## 2023-09-04 ENCOUNTER — Encounter: Payer: Self-pay | Admitting: Physician Assistant

## 2023-09-04 VITALS — BP 138/82 | HR 108 | Temp 98.5°F | Ht 75.0 in | Wt 200.3 lb

## 2023-09-04 DIAGNOSIS — I10 Essential (primary) hypertension: Secondary | ICD-10-CM

## 2023-09-04 DIAGNOSIS — Z91148 Patient's other noncompliance with medication regimen for other reason: Secondary | ICD-10-CM | POA: Diagnosis not present

## 2023-09-04 DIAGNOSIS — R7989 Other specified abnormal findings of blood chemistry: Secondary | ICD-10-CM

## 2023-09-04 DIAGNOSIS — F1010 Alcohol abuse, uncomplicated: Secondary | ICD-10-CM

## 2023-09-04 DIAGNOSIS — M109 Gout, unspecified: Secondary | ICD-10-CM | POA: Diagnosis not present

## 2023-09-04 DIAGNOSIS — K219 Gastro-esophageal reflux disease without esophagitis: Secondary | ICD-10-CM | POA: Diagnosis not present

## 2023-09-04 DIAGNOSIS — R739 Hyperglycemia, unspecified: Secondary | ICD-10-CM

## 2023-09-04 DIAGNOSIS — E781 Pure hyperglyceridemia: Secondary | ICD-10-CM

## 2023-09-04 MED ORDER — COLCHICINE 0.6 MG PO TABS
ORAL_TABLET | ORAL | 0 refills | Status: DC
Start: 2023-09-04 — End: 2024-11-01

## 2023-09-04 MED ORDER — ALLOPURINOL 100 MG PO TABS
200.0000 mg | ORAL_TABLET | Freq: Every day | ORAL | 0 refills | Status: DC
Start: 2023-09-04 — End: 2024-02-26

## 2023-09-04 MED ORDER — VALSARTAN 80 MG PO TABS
80.0000 mg | ORAL_TABLET | Freq: Every day | ORAL | 0 refills | Status: DC
Start: 2023-09-04 — End: 2024-02-26

## 2023-09-04 MED ORDER — PANTOPRAZOLE SODIUM 40 MG PO TBEC
40.0000 mg | DELAYED_RELEASE_TABLET | Freq: Every day | ORAL | 0 refills | Status: DC
Start: 2023-09-04 — End: 2024-02-26

## 2023-09-04 MED ORDER — AMLODIPINE BESYLATE 10 MG PO TABS
10.0000 mg | ORAL_TABLET | Freq: Every day | ORAL | 0 refills | Status: DC
Start: 2023-09-04 — End: 2024-02-26

## 2023-09-04 MED ORDER — FENOFIBRATE 145 MG PO TABS: 145.0000 mg | ORAL_TABLET | Freq: Every day | ORAL | 1 refills | Status: DC

## 2023-09-04 NOTE — Progress Notes (Signed)
Patient states no concerns to speak about.

## 2023-09-04 NOTE — Progress Notes (Signed)
Patient ID: Jesus Phillips, male   DOB: Apr 05, 1986, 37 y.o.   MRN: [id number]   Jesus Phillips, is a 37 y.o. male  [id number]  [id number]  DOB - 1986-02-01  Chief Complaint  Patient presents with   Medical Management of Chronic Issues       Subjective:   Jesus Phillips is a 37 y.o. male here today for md RF.  Not checking BP as I recommended but he says he does take BP meds daily.  Scrolls through his phone as I am taking history.  Denies CP/HA/Dizziness/SOB.  He never picked up fenofibrate as recommended.  He does take allopurinol daily for gout prevention and he takes colchicine only when has a flare up.  Still working at [employer].  Admits to poor diet and no exercise.  Drinks alcohol but denies Excess.  No complaints today  No problems updated.  ALLERGIES: No Known Allergies  PAST MEDICAL HISTORY: Past Medical History:  Diagnosis Date   Gout     MEDICATIONS AT HOME: Prior to Admission medications   Medication Sig Start Date End Date Taking? Authorizing Provider  allopurinol (ZYLOPRIM) 100 MG tablet Take 2 tablets (200 mg total) by mouth daily. 09/04/23   Anders Simmonds, PA-C  amLODipine (NORVASC) 10 MG tablet Take 1 tablet (10 mg total) by mouth daily. 09/04/23   Anders Simmonds, PA-C  colchicine 0.6 MG tablet TAB 2 TABS AT START OF GOUT FLARE THEN 1 TAB 2 HRS LATER THEN 1 TAB BY MOUTH DAILY FOR 7 DAYS. 09/04/23   Anders Simmonds, PA-C  fenofibrate (TRICOR) 145 MG tablet Take 1 tablet (145 mg total) by mouth daily. 09/04/23   Anders Simmonds, PA-C  pantoprazole (PROTONIX) 40 MG tablet Take 1 tablet (40 mg total) by mouth daily. 09/04/23   Anders Simmonds, PA-C  valsartan (DIOVAN) 80 MG tablet Take 1 tablet (80 mg total) by mouth daily. 09/04/23   Lachina Salsberry, Marzella Schlein, PA-C    ROS: Neg HEENT Neg resp Neg cardiac Neg GI Neg GU Neg MS Neg psych Neg neuro  Objective:   Vitals:   09/04/23 0852 09/04/23 0904  BP: (!) 157/105 138/82  Pulse: (!) 108   Temp:  98.5 F (36.9 C)   TempSrc: Oral   SpO2: 100%   Weight: 200 lb 4.8 oz (90.9 kg)   Height: 6\' 3"  (1.905 m)    Exam General appearance : Awake, alert, not in any distress. Speech Clear. Not toxic looking HEENT: Atraumatic and Normocephalic, pupils equally reactive to light and accomodation Neck: Supple, no JVD. No cervical lymphadenopathy.  Chest: Good air entry bilaterally, CTAB.  No rales/rhonchi/wheezing CVS: S1 S2 regular, no murmurs.  Abdomen: Bowel sounds present, Non tender and not distended with no gaurding, rigidity or rebound. Extremities: B/L Lower Ext shows no edema, both legs are warm to touch Neurology: Awake alert, and oriented X 3, CN II-XII intact, Non focal Skin: No Rash  Data Review No results found for: "HGBA1C"  Assessment & Plan   1. Essential hypertension Not at goal but improved.  Please check BP OOO - amLODipine (NORVASC) 10 MG tablet; Take 1 tablet (10 mg total) by mouth daily.  Dispense: 90 tablet; Refill: 0 - valsartan (DIOVAN) 80 MG tablet; Take 1 tablet (80 mg total) by mouth daily.  Dispense: 90 tablet; Refill: 0 - Comprehensive metabolic panel  2. Gouty arthritis of foot stable - allopurinol (ZYLOPRIM) 100 MG tablet; Take 2 tablets (200 mg total) by mouth daily.  Dispense: 180 tablet; Refill: 0 - colchicine 0.6 MG tablet; TAB 2 TABS AT START OF GOUT FLARE THEN 1 TAB 2 HRS LATER THEN 1 TAB BY MOUTH DAILY FOR 7 DAYS.  Dispense: 90 tablet; Refill: 0  3. Gastroesophageal reflux disease without esophagitis stable - pantoprazole (PROTONIX) 40 MG tablet; Take 1 tablet (40 mg total) by mouth daily.  Dispense: 90 tablet; Refill: 0  4. Nonadherence to medication Discussed picking up fenofibrate  5. High triglycerides fenofibrate - Comprehensive metabolic panel - Hemoglobin A1c  6. Excessive drinking alcohol I have counseled the patient at length about substance abuse and addiction.  12 step meetings/recovery recommended.  Local 12 step meeting lists  were given and attendance was encouraged.  Patient expresses understanding.   - Comprehensive metabolic panel  7. Abnormal LFTs Likely due to #6 although he denies excess - Comprehensive metabolic panel  8. Hyperglycemia - Comprehensive metabolic panel - Hemoglobin A1c    Return in about 4 months (around 01/04/2024) for PCP for chronic conditions Dr Laural Benes .  The patient was given clear instructions to go to ER or return to medical center if symptoms don't improve, worsen or new problems develop. The patient verbalized understanding. The patient was told to call to get lab results if they haven't heard anything in the next week.      Georgian Co, PA-C [facility] [place], [place] [phone]   09/04/2023, 9:06 AM

## 2023-09-05 ENCOUNTER — Telehealth: Payer: Self-pay

## 2023-09-05 LAB — COMPREHENSIVE METABOLIC PANEL
ALT: 23 IU/L (ref 0–44)
AST: 51 IU/L — ABNORMAL HIGH (ref 0–40)
Albumin: 5.2 g/dL — ABNORMAL HIGH (ref 4.1–5.1)
Alkaline Phosphatase: 86 IU/L (ref 44–121)
BUN/Creatinine Ratio: 13 (ref 9–20)
BUN: 13 mg/dL (ref 6–20)
Bilirubin Total: 0.4 mg/dL (ref 0.0–1.2)
CO2: 20 mmol/L (ref 20–29)
Calcium: 9.5 mg/dL (ref 8.7–10.2)
Chloride: 101 mmol/L (ref 96–106)
Creatinine, Ser: 0.98 mg/dL (ref 0.76–1.27)
Globulin, Total: 2.5 g/dL (ref 1.5–4.5)
Glucose: 111 mg/dL — ABNORMAL HIGH (ref 70–99)
Potassium: 3.7 mmol/L (ref 3.5–5.2)
Sodium: 142 mmol/L (ref 134–144)
Total Protein: 7.7 g/dL (ref 6.0–8.5)
eGFR: 102 mL/min/{1.73_m2} (ref >59)

## 2023-09-05 LAB — HEMOGLOBIN A1C
Est. average glucose Bld gHb Est-mCnc: 111 mg/dL
Hgb A1c MFr Bld: 5.5 % (ref 4.8–5.6)

## 2023-09-05 NOTE — Telephone Encounter (Signed)
Pt was called and no vm was left due to mailbox being full. Information has been sent to nurse pool.

## 2023-09-05 NOTE — Telephone Encounter (Signed)
-----   Message from Georgian Co sent at 09/05/2023 10:40 AM EDT ----- Please call patient liver function still a little elevated but improved.  Alcohol cessation would probably bring this back to normal  blood sugar a little elevated but no diabetes.  Kidney function is normal.  Thanks, Georgian Co, PA-C

## 2024-02-24 ENCOUNTER — Other Ambulatory Visit: Payer: Self-pay | Admitting: Physician Assistant

## 2024-02-24 DIAGNOSIS — K219 Gastro-esophageal reflux disease without esophagitis: Secondary | ICD-10-CM

## 2024-02-24 DIAGNOSIS — M109 Gout, unspecified: Secondary | ICD-10-CM

## 2024-02-24 DIAGNOSIS — I10 Essential (primary) hypertension: Secondary | ICD-10-CM

## 2024-02-26 ENCOUNTER — Ambulatory Visit: Payer: PRIVATE HEALTH INSURANCE | Attending: Physician Assistant | Admitting: Physician Assistant

## 2024-02-26 ENCOUNTER — Encounter: Payer: Self-pay | Admitting: Physician Assistant

## 2024-02-26 VITALS — BP 135/79 | HR 108 | Ht 75.0 in | Wt 211.4 lb

## 2024-02-26 DIAGNOSIS — K219 Gastro-esophageal reflux disease without esophagitis: Secondary | ICD-10-CM

## 2024-02-26 DIAGNOSIS — M109 Gout, unspecified: Secondary | ICD-10-CM | POA: Diagnosis not present

## 2024-02-26 DIAGNOSIS — I10 Essential (primary) hypertension: Secondary | ICD-10-CM | POA: Diagnosis not present

## 2024-02-26 DIAGNOSIS — J302 Other seasonal allergic rhinitis: Secondary | ICD-10-CM | POA: Diagnosis not present

## 2024-02-26 DIAGNOSIS — F1010 Alcohol abuse, uncomplicated: Secondary | ICD-10-CM

## 2024-02-26 DIAGNOSIS — E781 Pure hyperglyceridemia: Secondary | ICD-10-CM

## 2024-02-26 MED ORDER — PANTOPRAZOLE SODIUM 40 MG PO TBEC: 40.0000 mg | DELAYED_RELEASE_TABLET | Freq: Every day | ORAL | 0 refills | Status: DC

## 2024-02-26 MED ORDER — VALSARTAN 80 MG PO TABS: 80.0000 mg | ORAL_TABLET | Freq: Every day | ORAL | 0 refills | Status: DC

## 2024-02-26 MED ORDER — ALLOPURINOL 100 MG PO TABS: 200.0000 mg | ORAL_TABLET | Freq: Every day | ORAL | 0 refills | Status: DC

## 2024-02-26 MED ORDER — AMLODIPINE BESYLATE 10 MG PO TABS: 10.0000 mg | ORAL_TABLET | Freq: Every day | ORAL | 0 refills | Status: DC

## 2024-02-26 MED ORDER — CETIRIZINE HCL 10 MG PO TABS: 10.0000 mg | ORAL_TABLET | Freq: Every day | ORAL | 11 refills | Status: DC

## 2024-02-26 MED ORDER — FENOFIBRATE 145 MG PO TABS: 145.0000 mg | ORAL_TABLET | Freq: Every day | ORAL | 1 refills | Status: DC

## 2024-02-26 NOTE — Progress Notes (Signed)
 Patient ID: Jesus Phillips, male   DOB: 1986/03/09, 38 y.o.   MRN: [id number]   Cross Matherne, is a 38 y.o. male  [id number]  [id number]  DOB - 1986-09-23  Chief Complaint  Patient presents with   Medication Refill       Subjective:   Jesus Phillips is a 38 y.o. male here today for medication RF.  He did take meds today and says BP OOO are normal.  He is still working at [employer].  Still drinking alcohol.  Denies abdominal pain, itching or skin discoloration.  He denies CP/SOB/HA or dizziness.    He is having some seasonal allergy issues and is taking OTC benadryl.  This has been going on about 1 month.  No fever or cough.  Sneezing, runny nose and itchy eyes   No problems updated.  ALLERGIES: No Known Allergies  PAST MEDICAL HISTORY: Past Medical History:  Diagnosis Date   Gout     MEDICATIONS AT HOME: Prior to Admission medications   Medication Sig Start Date End Date Taking? Authorizing Provider  cetirizine (ZYRTEC) 10 MG tablet Take 1 tablet (10 mg total) by mouth daily. 02/26/24  Yes Winston Sobczyk, Marzella Schlein, PA-C  colchicine 0.6 MG tablet TAB 2 TABS AT START OF GOUT FLARE THEN 1 TAB 2 HRS LATER THEN 1 TAB BY MOUTH DAILY FOR 7 DAYS. 09/04/23  Yes Anders Simmonds, PA-C  allopurinol (ZYLOPRIM) 100 MG tablet Take 2 tablets (200 mg total) by mouth daily. 02/26/24   Anders Simmonds, PA-C  amLODipine (NORVASC) 10 MG tablet Take 1 tablet (10 mg total) by mouth daily. 02/26/24   Anders Simmonds, PA-C  fenofibrate (TRICOR) 145 MG tablet Take 1 tablet (145 mg total) by mouth daily. 02/26/24   Anders Simmonds, PA-C  pantoprazole (PROTONIX) 40 MG tablet Take 1 tablet (40 mg total) by mouth daily. 02/26/24   Anders Simmonds, PA-C  valsartan (DIOVAN) 80 MG tablet Take 1 tablet (80 mg total) by mouth daily. 02/26/24   Kailan Laws, Marzella Schlein, PA-C    ROS: Neg resp Neg cardiac Neg GI Neg GU Neg MS Neg psych Neg neuro  Objective:   Vitals:   02/26/24 1616 02/26/24 1628  BP:  (!) 161/95 135/79  Pulse: (!) 108   SpO2: 100%   Weight: 211 lb 6.4 oz (95.9 kg)   Height: 6\' 3"  (1.905 m)    Exam General appearance : Awake, alert, not in any distress. Speech Clear. Not toxic looking HEENT: Atraumatic and Normocephalic Neck: Supple, no JVD. No cervical lymphadenopathy.  Chest: Good air entry bilaterally, CTAB.  No rales/rhonchi/wheezing CVS: S1 S2 regular, no murmurs.  Extremities: B/L Lower Ext shows no edema, both legs are warm to touch Neurology: Awake alert, and oriented X 3, CN II-XII intact, Non focal Skin: No Rash  Data Review Lab Results  Component Value Date   HGBA1C 5.5 09/04/2023    Assessment & Plan   1. Essential hypertension Adequate control on retake and controlled based on OOO readings - valsartan (DIOVAN) 80 MG tablet; Take 1 tablet (80 mg total) by mouth daily.  Dispense: 90 tablet; Refill: 0 - amLODipine (NORVASC) 10 MG tablet; Take 1 tablet (10 mg total) by mouth daily.  Dispense: 90 tablet; Refill: 0 - Comprehensive metabolic panel - Lipid Panel  2. Gastroesophageal reflux disease without esophagitis - pantoprazole (PROTONIX) 40 MG tablet; Take 1 tablet (40 mg total) by mouth daily.  Dispense: 90 tablet; Refill: 0  3. Gouty arthritis of  foot This keeps gout controlled.  No flares in a long while! - allopurinol (ZYLOPRIM) 100 MG tablet; Take 2 tablets (200 mg total) by mouth daily.  Dispense: 180 tablet; Refill: 0  4. Seasonal allergies (Primary) - cetirizine (ZYRTEC) 10 MG tablet; Take 1 tablet (10 mg total) by mouth daily.  Dispense: 30 tablet; Refill: 11  5. High triglycerides He is fasting today - fenofibrate (TRICOR) 145 MG tablet; Take 1 tablet (145 mg total) by mouth daily.  Dispense: 90 tablet; Refill: 1 - Lipid Panel  6. Excessive drinking alcohol I have counseled the patient at length about substance abuse and addiction.  12 step meetings/recovery recommended.  Local 12 step meeting lists were given and attendance was  encouraged.  Patient expresses understanding. He says it is not problematic.  H/o increased LFT     Return in about 6 months (around 08/28/2024) for PCP for chronic conditions.  The patient was given clear instructions to go to ER or return to medical center if symptoms don't improve, worsen or new problems develop. The patient verbalized understanding. The patient was told to call to get lab results if they haven't heard anything in the next week.      Georgian Co, PA-C [facility] [place], [place] [phone]   02/26/2024, 5:02 PM

## 2024-02-27 LAB — LIPID PANEL
Chol/HDL Ratio: 3.3 ratio (ref 0.0–5.0)
Cholesterol, Total: 156 mg/dL (ref 100–199)
HDL: 48 mg/dL (ref >39)
LDL Chol Calc (NIH): 35 mg/dL (ref 0–99)
Triglycerides: 528 mg/dL — ABNORMAL HIGH (ref 0–149)
VLDL Cholesterol Cal: 73 mg/dL — ABNORMAL HIGH (ref 5–40)

## 2024-02-27 LAB — COMPREHENSIVE METABOLIC PANEL
ALT: 12 IU/L (ref 0–44)
AST: 26 IU/L (ref 0–40)
Albumin: 5.2 g/dL — ABNORMAL HIGH (ref 4.1–5.1)
Alkaline Phosphatase: 51 IU/L (ref 44–121)
BUN/Creatinine Ratio: 13 (ref 9–20)
BUN: 13 mg/dL (ref 6–20)
Bilirubin Total: 0.4 mg/dL (ref 0.0–1.2)
CO2: 19 mmol/L — ABNORMAL LOW (ref 20–29)
Calcium: 9.8 mg/dL (ref 8.7–10.2)
Chloride: 103 mmol/L (ref 96–106)
Creatinine, Ser: 1.04 mg/dL (ref 0.76–1.27)
Globulin, Total: 2.9 g/dL (ref 1.5–4.5)
Glucose: 95 mg/dL (ref 70–99)
Potassium: 3.7 mmol/L (ref 3.5–5.2)
Sodium: 144 mmol/L (ref 134–144)
Total Protein: 8.1 g/dL (ref 6.0–8.5)
eGFR: 95 mL/min/{1.73_m2} (ref >59)

## 2024-05-25 ENCOUNTER — Emergency Department (HOSPITAL_COMMUNITY): Payer: PRIVATE HEALTH INSURANCE

## 2024-05-25 ENCOUNTER — Emergency Department (HOSPITAL_COMMUNITY)
Admission: EM | Admit: 2024-05-25 | Discharge: 2024-05-26 | Disposition: A | Discharge status: Home / Self Care | Payer: PRIVATE HEALTH INSURANCE | Attending: Emergency Medicine | Admitting: Emergency Medicine

## 2024-05-25 ENCOUNTER — Other Ambulatory Visit: Payer: Self-pay

## 2024-05-25 ENCOUNTER — Encounter (HOSPITAL_COMMUNITY): Payer: Self-pay

## 2024-05-25 DIAGNOSIS — R748 Abnormal levels of other serum enzymes: Secondary | ICD-10-CM | POA: Diagnosis not present

## 2024-05-25 DIAGNOSIS — Z79899 Other long term (current) drug therapy: Secondary | ICD-10-CM | POA: Insufficient documentation

## 2024-05-25 DIAGNOSIS — I10 Essential (primary) hypertension: Secondary | ICD-10-CM | POA: Diagnosis not present

## 2024-05-25 DIAGNOSIS — K852 Alcohol induced acute pancreatitis without necrosis or infection: Secondary | ICD-10-CM | POA: Insufficient documentation

## 2024-05-25 DIAGNOSIS — E876 Hypokalemia: Secondary | ICD-10-CM | POA: Insufficient documentation

## 2024-05-25 LAB — COMPREHENSIVE METABOLIC PANEL WITH GFR
ALT: 13 U/L (ref 0–44)
AST: 28 U/L (ref 15–41)
Albumin: 4.7 g/dL (ref 3.5–5.0)
Alkaline Phosphatase: 55 U/L (ref 38–126)
Anion gap: 18 — ABNORMAL HIGH (ref 5–15)
BUN: 10 mg/dL (ref 6–20)
CO2: 23 mmol/L (ref 22–32)
Calcium: 9.9 mg/dL (ref 8.9–10.3)
Chloride: 97 mmol/L — ABNORMAL LOW (ref 98–111)
Creatinine, Ser: 1.07 mg/dL (ref 0.61–1.24)
GFR, Estimated: >60 mL/min (ref >60)
Glucose, Bld: 149 mg/dL — ABNORMAL HIGH (ref 70–99)
Potassium: 2.8 mmol/L — ABNORMAL LOW (ref 3.5–5.1)
Sodium: 138 mmol/L (ref 135–145)
Total Bilirubin: 1.4 mg/dL — ABNORMAL HIGH (ref 0.0–1.2)
Total Protein: 8.9 g/dL — ABNORMAL HIGH (ref 6.5–8.1)

## 2024-05-25 LAB — CBC
HCT: 45.4 % (ref 39.0–52.0)
Hemoglobin: 15.4 g/dL (ref 13.0–17.0)
MCH: 32.2 pg (ref 26.0–34.0)
MCHC: 33.9 g/dL (ref 30.0–36.0)
MCV: 95 fL (ref 80.0–100.0)
Platelets: 460 10*3/uL — ABNORMAL HIGH (ref 150–400)
RBC: 4.78 MIL/uL (ref 4.22–5.81)
RDW: 13.7 % (ref 11.5–15.5)
WBC: 10.5 10*3/uL (ref 4.0–10.5)
nRBC: 0 % (ref 0.0–0.2)

## 2024-05-25 LAB — LIPASE, BLOOD: Lipase: 814 U/L — ABNORMAL HIGH (ref 11–51)

## 2024-05-25 MED ORDER — ONDANSETRON HCL 4 MG/2ML IJ SOLN
4.0000 mg | Freq: Once | INTRAMUSCULAR | Status: AC
  Administered 2024-05-26: 4 mg via INTRAVENOUS
  Filled 2024-05-25: qty 2

## 2024-05-25 MED ORDER — SODIUM CHLORIDE 0.9 % IV BOLUS (SEPSIS)
1000.0000 mL | Freq: Once | INTRAVENOUS | Status: AC
  Administered 2024-05-26: 1000 mL via INTRAVENOUS

## 2024-05-25 MED ORDER — POTASSIUM CHLORIDE 10 MEQ/100ML IV SOLN
10.0000 meq | Freq: Once | INTRAVENOUS | Status: AC
  Administered 2024-05-26: 10 meq via INTRAVENOUS
  Filled 2024-05-25: qty 100

## 2024-05-25 MED ORDER — FENTANYL CITRATE PF 50 MCG/ML IJ SOSY
100.0000 ug | PREFILLED_SYRINGE | Freq: Once | INTRAMUSCULAR | Status: AC
  Administered 2024-05-26: 100 ug via INTRAVENOUS
  Filled 2024-05-25: qty 2

## 2024-05-25 NOTE — ED Triage Notes (Signed)
 Pt. Arrives pov. For epigastric abdominal pain states that he thought he had acid reflux. Took tums and alka-seltzer  has had no relief. Denies nausea and vomiting. Hr 130s in triage.

## 2024-05-25 NOTE — ED Provider Notes (Signed)
 [place] EMERGENCY DEPARTMENT AT [facility] Provider Note   CSN: [id number] Arrival date & time: 05/25/24  2219     History {Add pertinent medical, surgical, social history, OB history to HPI:1} Chief Complaint  Patient presents with   Abdominal Pain    Jesus Phillips is a 38 y.o. male.  The history is provided by the patient.  Abdominal Pain Pain location:  Epigastric Pain quality: sharp   Pain radiates to:  Does not radiate Pain severity:  Severe Onset quality:  Gradual Duration:  1 day Timing:  Constant Progression:  Worsening Chronicity:  New Context: alcohol use   Relieved by:  Nothing Worsened by:  Movement and palpation Patient history of gout, hypertension presents for upper abdominal pain.  Patient reports he had onset of pain yesterday soon after taking a shot of liquor.  He reports nausea, he is self inducing vomiting.  No diarrhea or constipation, no blood or melena in his stool.  Denies any chronic NSAID use but does report frequent alcohol use He does not recall having his pain previously No chest pain or shortness of breath.    Past Medical History:  Diagnosis Date   Gout     Home Medications Prior to Admission medications   Medication Sig Start Date End Date Taking? Authorizing Provider  allopurinol  (ZYLOPRIM ) 100 MG tablet Take 2 tablets (200 mg total) by mouth daily. 02/26/24   Hassie Lint, PA-C  amLODipine  (NORVASC ) 10 MG tablet Take 1 tablet (10 mg total) by mouth daily. 02/26/24   Hassie Lint, PA-C  cetirizine  (ZYRTEC ) 10 MG tablet Take 1 tablet (10 mg total) by mouth daily. 02/26/24   McClung, Angela M, PA-C  colchicine  0.6 MG tablet TAB 2 TABS AT START OF GOUT FLARE THEN 1 TAB 2 HRS LATER THEN 1 TAB BY MOUTH DAILY FOR 7 DAYS. 09/04/23   Hassie Lint, PA-C  fenofibrate  (TRICOR ) 145 MG tablet Take 1 tablet (145 mg total) by mouth daily. 02/26/24   Hassie Lint, PA-C  pantoprazole  (PROTONIX ) 40 MG tablet Take 1  tablet (40 mg total) by mouth daily. 02/26/24   Hassie Lint, PA-C  valsartan  (DIOVAN ) 80 MG tablet Take 1 tablet (80 mg total) by mouth daily. 02/26/24   Hassie Lint, PA-C      Allergies    Patient has no known allergies.    Review of Systems   Review of Systems  Gastrointestinal:  Positive for abdominal pain. Negative for blood in stool.    Physical Exam Updated Vital Signs BP (!) 151/104   Pulse (!) 139   Temp 98.1 F (36.7 C) (Oral)   Resp 17   SpO2 100%  Physical Exam CONSTITUTIONAL: Well developed/well nourished, uncomfortable. HEAD: Normocephalic/atraumatic EYES: EOMI/PERRL, no significant icterus ENMT: Mucous membranes moist NECK: supple no meningeal signs SPINE/BACK:entire spine nontender CV: S1/S2 noted, no murmurs/rubs/gallops noted LUNGS: Lungs are clear to auscultation bilaterally, no apparent distress ABDOMEN: soft, moderate epigastric tenderness, no rebound or guarding, bowel sounds noted throughout abdomen NEURO: Pt is awake/alert/appropriate, moves all extremitiesx4.  No facial droop.   EXTREMITIES: pulses normal/equal, full ROM SKIN: warm, color normal  ED Results / Procedures / Treatments   Labs (all labs ordered are listed, but only abnormal results are displayed) Labs Reviewed  LIPASE, BLOOD - Abnormal; Notable for the following components:      Result Value   Lipase 814 (*)    All other components within normal limits  COMPREHENSIVE METABOLIC  PANEL WITH GFR - Abnormal; Notable for the following components:   Potassium 2.8 (*)    Chloride 97 (*)    Glucose, Bld 149 (*)    Total Protein 8.9 (*)    Total Bilirubin 1.4 (*)    Anion gap 18 (*)    All other components within normal limits  CBC - Abnormal; Notable for the following components:   Platelets 460 (*)    All other components within normal limits  URINALYSIS, ROUTINE W REFLEX MICROSCOPIC  MAGNESIUM    EKG EKG Interpretation Date/Time:  Tuesday May 25 2024 22:30:18  EDT Ventricular Rate:  121 PR Interval:  156 QRS Duration:  82 QT Interval:  307 QTC Calculation: 436 R Axis:   54  Text Interpretation: Sinus tachycardia Nonspecific T abnormalities, diffuse leads Interpretation limited secondary to artifact Confirmed by Eldon Greenland ([id number]) on 05/25/2024 11:03:01 PM  Radiology No results found.  Procedures Procedures  {Document cardiac monitor, telemetry assessment procedure when appropriate:1}  Medications Ordered in ED Medications  fentaNYL (SUBLIMAZE) injection 100 mcg (has no administration in time range)  ondansetron  (ZOFRAN ) injection 4 mg (has no administration in time range)  sodium chloride  0.9 % bolus 1,000 mL (has no administration in time range)  potassium chloride 10 mEq in 100 mL IVPB (has no administration in time range)    ED Course/ Medical Decision Making/ A&P   {   Click here for ABCD2, HEART and other calculatorsREFRESH Note before signing :1}                              Medical Decision Making Amount and/or Complexity of Data Reviewed Labs: ordered. Radiology: ordered. ECG/medicine tests: ordered.  Risk Prescription drug management.   This patient presents to the ED for concern of abdominal pain, this involves an extensive number of treatment options, and is a complaint that carries with it a high risk of complications and morbidity.  The differential diagnosis includes but is not limited to cholecystitis, cholelithiasis, pancreatitis, gastritis, peptic ulcer disease, appendicitis, bowel obstruction, bowel perforation, diverticulitis, AAA, ischemic bowel   Comorbidities that complicate the patient evaluation: Patient's presentation is complicated by their history of hypertension  Social Determinants of Health: Patient's alcohol use  increases the complexity of managing their presentation  Additional history obtained: Records reviewed Primary Care Documents  Lab Tests: I Ordered, and personally interpreted  labs.  The pertinent results include: Elevated lipase consistent with pancreatitis  Imaging Studies ordered: I ordered imaging studies including CT scan abdomen pelvis  I independently visualized and interpreted imaging which showed *** I agree with the radiologist interpretation  Cardiac Monitoring: The patient was maintained on a cardiac monitor.  I personally viewed and interpreted the cardiac monitor which showed an underlying rhythm of:  sinus tachycardia  Medicines ordered and prescription drug management: I ordered medication including fentanyl for pain Reevaluation of the patient after these medicines showed that the patient    {resolved/improved/worsened:23923::"improved"}  Test Considered: Patient is low risk / negative by ***, therefore do not feel that *** is indicated.  Critical Interventions:  ***  Consultations Obtained: I requested consultation with the {consultation:26851}, and discussed  findings as well as pertinent plan - they recommend: ***  Reevaluation: After the interventions noted above, I reevaluated the patient and found that they have :{resolved/improved/worsened:23923::"improved"}  Complexity of problems addressed: Patient's presentation is most consistent with  {GMWN:02725}  Disposition: After consideration of the diagnostic results and  the patient's response to treatment,  I feel that the patent would benefit from {disposition:26850}.     {Document critical care time when appropriate:1} {Document review of labs and clinical decision tools ie heart score, Chads2Vasc2 etc:1}  {Document your independent review of radiology images, and any outside records:1} {Document your discussion with family members, caretakers, and with consultants:1} {Document social determinants of health affecting pt's care:1} {Document your decision making why or why not admission, treatments were needed:1} Final Clinical Impression(s) / ED Diagnoses Final diagnoses:   None    Rx / DC Orders ED Discharge Orders     None

## 2024-05-26 ENCOUNTER — Encounter (HOSPITAL_COMMUNITY): Payer: Self-pay

## 2024-05-26 LAB — MAGNESIUM: Magnesium: 1.5 mg/dL — ABNORMAL LOW (ref 1.7–2.4)

## 2024-05-26 MED ORDER — POTASSIUM CHLORIDE CRYS ER 20 MEQ PO TBCR
40.0000 meq | EXTENDED_RELEASE_TABLET | Freq: Once | ORAL | Status: AC
  Administered 2024-05-26: 40 meq via ORAL
  Filled 2024-05-26: qty 2

## 2024-05-26 MED ORDER — MAGNESIUM SULFATE 2 GM/50ML IV SOLN
2.0000 g | Freq: Once | INTRAVENOUS | Status: AC
  Administered 2024-05-26: 2 g via INTRAVENOUS
  Filled 2024-05-26: qty 50

## 2024-05-26 MED ORDER — ONDANSETRON HCL 4 MG PO TABS: 4.0000 mg | ORAL_TABLET | Freq: Three times a day (TID) | ORAL | 0 refills | Status: DC | PRN

## 2024-05-26 MED ORDER — HYDROCODONE-ACETAMINOPHEN 5-325 MG PO TABS: 1.0000 | ORAL_TABLET | Freq: Four times a day (QID) | ORAL | 0 refills | Status: DC | PRN

## 2024-05-26 MED ORDER — IOHEXOL 300 MG/ML  SOLN
100.0000 mL | Freq: Once | INTRAMUSCULAR | Status: AC | PRN
  Administered 2024-05-26: 100 mL via INTRAVENOUS

## 2024-05-26 MED ORDER — KETOROLAC TROMETHAMINE 15 MG/ML IJ SOLN
15.0000 mg | Freq: Once | INTRAMUSCULAR | Status: AC
  Administered 2024-05-26: 15 mg via INTRAVENOUS
  Filled 2024-05-26: qty 1

## 2024-06-03 ENCOUNTER — Other Ambulatory Visit: Payer: Self-pay | Admitting: Physician Assistant

## 2024-06-03 DIAGNOSIS — K219 Gastro-esophageal reflux disease without esophagitis: Secondary | ICD-10-CM

## 2024-06-03 DIAGNOSIS — M109 Gout, unspecified: Secondary | ICD-10-CM

## 2024-06-03 DIAGNOSIS — I10 Essential (primary) hypertension: Secondary | ICD-10-CM

## 2024-09-02 ENCOUNTER — Ambulatory Visit: Payer: PRIVATE HEALTH INSURANCE | Attending: Internal Medicine | Admitting: Internal Medicine

## 2024-09-02 ENCOUNTER — Encounter: Payer: Self-pay | Admitting: Internal Medicine

## 2024-09-02 VITALS — BP 129/76 | HR 85 | Temp 98.2°F | Ht 75.0 in | Wt 201.0 lb

## 2024-09-02 DIAGNOSIS — I10 Essential (primary) hypertension: Secondary | ICD-10-CM

## 2024-09-02 DIAGNOSIS — Z8739 Personal history of other diseases of the musculoskeletal system and connective tissue: Secondary | ICD-10-CM

## 2024-09-02 DIAGNOSIS — E781 Pure hyperglyceridemia: Secondary | ICD-10-CM | POA: Diagnosis not present

## 2024-09-02 DIAGNOSIS — Z2821 Immunization not carried out because of patient refusal: Secondary | ICD-10-CM

## 2024-09-02 DIAGNOSIS — F17200 Nicotine dependence, unspecified, uncomplicated: Secondary | ICD-10-CM | POA: Diagnosis not present

## 2024-09-02 DIAGNOSIS — F1090 Alcohol use, unspecified, uncomplicated: Secondary | ICD-10-CM

## 2024-09-02 MED ORDER — VALSARTAN 80 MG PO TABS: 80.0000 mg | ORAL_TABLET | Freq: Every day | ORAL | 1 refills | Status: AC

## 2024-09-02 MED ORDER — FENOFIBRATE 145 MG PO TABS: 145.0000 mg | ORAL_TABLET | Freq: Every day | ORAL | 1 refills | Status: AC

## 2024-09-02 MED ORDER — ALLOPURINOL 100 MG PO TABS: 200.0000 mg | ORAL_TABLET | Freq: Every day | ORAL | 1 refills | Status: AC

## 2024-09-02 MED ORDER — AMLODIPINE BESYLATE 10 MG PO TABS: 10.0000 mg | ORAL_TABLET | Freq: Every day | ORAL | 1 refills | Status: AC

## 2024-09-02 NOTE — Progress Notes (Signed)
 Patient ID: Jesus Phillips, male    DOB: July 29, 1986  MRN: [id number]  CC: Hypertension (HTN f/u. /No questions / concerns/No to flu vax)   Subjective: Jesus Phillips is a 38 y.o. male who presents for chronic ds management. His concerns today include:  Patient with history of HTN, probable gout, tobacco dependence, ETOH use disorder, HLD, GERD  HTN: BP 129/76 today. The patient took their BP pills this morning. Is taking amlodipine  10 mg daily, valsartan  80 mg daily. Reports compliance with meds.  Does not check BP at home. The patient is limiting salt in diet, does not like the taste of it.  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 Diet: Lately he has been trying to eat healthier: stopped eating steak, is limiting red meats, and eating more chicken and seafood. Also trying to eat more salad.  Alcohol: He cannot remember how many daily drinks he has, it varies but reports >3 small glasses of tequila daily on average. He has decreased use since 05/2024 when he presented to the ED with severe abd pain, found to be Alcohol-induced acute pancreatitis via CT scan and Lipase >800. CMP at the time also showed low potassium. Denies any abd pain since then. He also went 2 weeks without drinking during this time due to epigastric pain. Denies alcohol withdrawal sx such as seizures, tremors, or nausea during those 2 weeks. Wants to cut back on alcohol but not wanting to quit completely.  Tobacco dependence: 1 pack cigarettes every 5-6 days. Does not want quit. Declined intervention for tobacco cessation.  Elevated triglycerides: triglycerides were 528 in 02/2024. Should be on Tricor  but pt unsure if he is taking it.  Gout: Takes allopurinol  consistently without any recent flares/need for colchicine . Denies current flare up.  HM Declined all vaccines:  Pneumococcal Vaccine(1 of 2 - PCV) Never done Hepatitis B Vaccines 19-59 Average Risk(1 of 3 - 19+ 3-dose series) Never done HPV  VACCINES(1 - 3-dose SCDM series) Never done Influenza Vaccine Never done   Patient Active Problem List   Diagnosis Date Noted   AKI (acute kidney injury) (HCC) 10/18/2021   LFT elevation 10/18/2021   Tobacco dependence 06/28/2021   Essential hypertension 06/28/2021   Overweight (BMI 25.0-29.9) 06/28/2021   Gastroesophageal reflux disease without esophagitis 06/28/2021   History of gout 02/03/2020   Plantar fasciitis 02/03/2020   Alcohol use disorder, mild, abuse 02/03/2020     Current Outpatient Medications on File Prior to Visit  Medication Sig Dispense Refill   colchicine  0.6 MG tablet TAB 2 TABS AT START OF GOUT FLARE THEN 1 TAB 2 HRS LATER THEN 1 TAB BY MOUTH DAILY FOR 7 DAYS. 90 tablet 0   HYDROcodone -acetaminophen  (NORCO/VICODIN) 5-325 MG tablet Take 1 tablet by mouth every 6 (six) hours as needed for severe pain (pain score 7-10). 10 tablet 0   ondansetron  (ZOFRAN ) 4 MG tablet Take 1 tablet (4 mg total) by mouth every 8 (eight) hours as needed for nausea or vomiting. 10 tablet 0   pantoprazole  (PROTONIX ) 40 MG tablet TAKE 1 TABLET BY MOUTH EVERY DAY 90 tablet 0   cetirizine  (ZYRTEC ) 10 MG tablet Take 1 tablet (10 mg total) by mouth daily. (Patient not taking: Reported on 09/02/2024) 30 tablet 11   No current facility-administered medications on file prior to visit.    No Known Allergies  Social History   Socioeconomic History   Marital status: Single    Spouse name: Not on file  Number of children: Not on file   Years of education: Not on file   Highest education level: Not on file  Occupational History   Not on file  Tobacco Use   Smoking status: Every Day    Current packs/day: 0.50    Types: Cigarettes   Smokeless tobacco: Never  Vaping Use   Vaping status: Never Used  Substance and Sexual Activity   Alcohol use: Yes   Drug use: Yes    Types: Marijuana   Sexual activity: Yes    Birth control/protection: Condom  Other Topics Concern   Not on file  Social  History Narrative   Not on file   Social Drivers of Health   Financial Resource Strain: Not on file  Food Insecurity: Not on file  Transportation Needs: Not on file  Physical Activity: Not on file  Stress: Not on file  Social Connections: Not on file  Intimate Partner Violence: Not on file    No family history on file.  No past surgical history on file.  ROS: Review of Systems Negative except as stated above  PHYSICAL EXAM: BP 129/76 (BP Location: Left Arm, Patient Position: Sitting, Cuff Size: Normal)   Pulse 85   Temp 98.2 F (36.8 C) (Oral)   Ht 6' 3 (1.905 m)   Wt 201 lb (91.2 kg)   SpO2 100%   BMI 25.12 kg/m   Physical Exam   General appearance - alert, well appearing, AA male in no distress Mental status - normal mood, behavior, speech, dress, motor activity, and thought processes Chest - clear to auscultation, no wheezes, rales or rhonchi, symmetric air entry Heart - normal rate, regular rhythm, normal S1, S2, no murmurs, rubs, clicks or gallops Extremities - no pedal edema noted     Latest Ref Rng & Units 05/25/2024   10:39 PM 02/26/2024    4:36 PM 09/04/2023    9:13 AM  CMP  Glucose 70 - 99 mg/dL 850  95  888   BUN 6 - 20 mg/dL 10  13  13    Creatinine 0.61 - 1.24 mg/dL 8.92  8.95  9.01   Sodium 135 - 145 mmol/L 138  144  142   Potassium 3.5 - 5.1 mmol/L 2.8  3.7  3.7   Chloride 98 - 111 mmol/L 97  103  101   CO2 22 - 32 mmol/L 23  19  20    Calcium 8.9 - 10.3 mg/dL 9.9  9.8  9.5   Total Protein 6.5 - 8.1 g/dL 8.9  8.1  7.7   Total Bilirubin 0.0 - 1.2 mg/dL 1.4  0.4  0.4   Alkaline Phos 38 - 126 U/L 55  51  86   AST 15 - 41 U/L 28  26  51   ALT 0 - 44 U/L 13  12  23     Lipid Panel     Component Value Date/Time   CHOL 156 02/26/2024 1636   TRIG 528 (H) 02/26/2024 1636   HDL 48 02/26/2024 1636   CHOLHDL 3.3 02/26/2024 1636   LDLCALC 35 02/26/2024 1636    CBC    Component Value Date/Time   WBC 10.5 05/25/2024 2239   RBC 4.78 05/25/2024 2239    HGB 15.4 05/25/2024 2239   HGB 14.5 03/06/2023 0950   HCT 45.4 05/25/2024 2239   HCT 41.8 03/06/2023 0950   PLT 460 (H) 05/25/2024 2239   PLT 392 03/06/2023 0950   MCV 95.0 05/25/2024 2239  MCV 89 03/06/2023 0950   MCH 32.2 05/25/2024 2239   MCHC 33.9 05/25/2024 2239   RDW 13.7 05/25/2024 2239   RDW 12.0 03/06/2023 0950   LYMPHSABS 2.1 03/06/2023 0950   EOSABS 0.0 03/06/2023 0950   BASOSABS 0.0 03/06/2023 0950    ASSESSMENT AND PLAN: 1. Essential hypertension At goal. The patient has been cutting back on red meats, eating more chicken and seafood, and incorporating more salads into the diet. These changes are encouraged to continue, as well as daily physical activity. Continue amLODipine  (NORVASC ) 10 MG tablet and valsartan  (DIOVAN ) 80 MG tablet.  - amLODipine  (NORVASC ) 10 MG tablet; Take 1 tablet (10 mg total) by mouth daily.  Dispense: 90 tablet; Refill: 1 - valsartan  (DIOVAN ) 80 MG tablet; Take 1 tablet (80 mg total) by mouth daily.  Dispense: 90 tablet; Refill: 1 - Advised to check BP at home at least once a week  2. Tobacco dependence Encouraged smoking cessation. Offered intervention for this but pt declined as he is not ready to quit.  3. Alcohol use disorder (Primary) Patient was encouraged to continue cutting back and try to quit completely. Advise of  the risks of excessive daily alcohol use including recurrent pancreatitis. Discuss use of Naltrexone if and when he is ready to quit. Also encourage to consider AA. Let us  know when he is ready to quit completely. - Lipid Panel - Hepatic Function Panel  4. Hypertriglyceridemia Advise to avoid fried and greasy foods and to cook with healthier oils like Olive Oil.  -encourage to take the Fenofibrate  Informed high TG can also cause pancreatitis. - Lipid Panel - Hepatic Function Panel  5. Hx of Gout Continue allopurinol  (ZYLOPRIM ) 100 MG tablet daily and colchicine  0.6 MG tablet PRN. - allopurinol  (ZYLOPRIM ) 100 MG  tablet; Take 2 tablets (200 mg total) by mouth daily.  Dispense: 180 tablet; Refill: 1  6. Influenza vaccination declined  7. Pneumococcal vaccination declined  8. Human papilloma virus (HPV) vaccination declined  9. Hepatitis B vaccination declined  Patient was given the opportunity to ask questions.  Patient verbalized understanding of the plan and was able to repeat key elements of the plan.    Orders Placed This Encounter  Procedures   Lipid Panel   Hepatic Function Panel     Requested Prescriptions   Signed Prescriptions Disp Refills   allopurinol  (ZYLOPRIM ) 100 MG tablet 180 tablet 1    Sig: Take 2 tablets (200 mg total) by mouth daily.   amLODipine  (NORVASC ) 10 MG tablet 90 tablet 1    Sig: Take 1 tablet (10 mg total) by mouth daily.   fenofibrate  (TRICOR ) 145 MG tablet 90 tablet 1    Sig: Take 1 tablet (145 mg total) by mouth daily.   valsartan  (DIOVAN ) 80 MG tablet 90 tablet 1    Sig: Take 1 tablet (80 mg total) by mouth daily.    Return in about 4 months (around 01/02/2025).  This is a Psychologist, occupational Note.  The care of the patient was discussed with Dr. Vicci and the assessment and plan formulated with her assistance.  Please see her attestation of this encounter.  Duwaine Amber, MS3 [employer]  Evaluation and management procedures were performed by me with Medical Student in attendance, note written by Medical Student under my supervision and collaboration. I have reviewed the note and I agree with the management and plan.  Barnie Vicci, MD, FAAFP. [facility] [place], [place] [phone]   09/02/2024, 5:48  PM

## 2024-09-02 NOTE — Patient Instructions (Addendum)
-  SMOKING: You should stop smoking. Stopping will help improve your overall health and lower your risk for many diseases such as lung cancer, colon cancer, COPD. Let us  know if you become ready to quit.  -ALCOHOL: Please work on reducing and stopping alcohol use. Quitting would be the best option. Pancreatitis can reoccur which can become chronic pancreatitis. Limiting alcohol will improve your health. When you are ready to quit completely let me know and we could give you medications for cravings.   -TRIGLYCERIDES: Your triglyceride levels are high. Please cut back on fried foods, alcohol, and excess cooking oils. Make sure you are taking your fenofibrate  again as prescribed, since high triglycerides increase your risk of pancreatitis.  -HYPERTENSION: Your blood pressure is at goal today. Please continue taking amlodipine  and valsartan  as prescribed. Please check your BP at home at least once a week  -LIFESTYLE: Please continue healthy eating habits. You have been cutting back on red meats, eating more chicken and seafood, and adding more salads. Keep it up!

## 2024-09-03 LAB — HEPATIC FUNCTION PANEL
ALT: 15 IU/L (ref 0–44)
AST: 17 IU/L (ref 0–40)
Albumin: 4.5 g/dL (ref 4.1–5.1)
Alkaline Phosphatase: 68 IU/L (ref 47–123)
Bilirubin Total: <0.2 mg/dL (ref 0.0–1.2)
Bilirubin, Direct: <0.08 mg/dL (ref 0.00–0.40)
Total Protein: 6.9 g/dL (ref 6.0–8.5)

## 2024-09-03 LAB — LIPID PANEL
Chol/HDL Ratio: 1.7 ratio (ref 0.0–5.0)
Cholesterol, Total: 120 mg/dL (ref 100–199)
HDL: 69 mg/dL (ref >39)
LDL Chol Calc (NIH): 37 mg/dL (ref 0–99)
Triglycerides: 70 mg/dL (ref 0–149)
VLDL Cholesterol Cal: 14 mg/dL (ref 5–40)

## 2024-09-04 ENCOUNTER — Other Ambulatory Visit: Payer: Self-pay | Admitting: Internal Medicine

## 2024-09-04 ENCOUNTER — Ambulatory Visit: Payer: Self-pay | Admitting: Internal Medicine

## 2024-09-04 DIAGNOSIS — K219 Gastro-esophageal reflux disease without esophagitis: Secondary | ICD-10-CM

## 2024-09-04 NOTE — Progress Notes (Signed)
 Cholesterol levels have normalized. If he has been taking the Fenofibrate  consistently every day for the past 2 mths, he should continue taking it.  If he had not been taking it, then he does not have to start it. Liver function test normal.

## 2024-10-29 ENCOUNTER — Telehealth: Payer: Self-pay | Admitting: Internal Medicine

## 2024-10-29 ENCOUNTER — Other Ambulatory Visit: Payer: Self-pay | Admitting: Internal Medicine

## 2024-10-29 NOTE — Telephone Encounter (Signed)
 Copied from CRM #[id number]. Topic: Referral - Request for Referral >> Oct 29, 2024 10:16 AM Joesph NOVAK wrote:  Did the patient discuss referral with their provider in the last year? Yes (If No - schedule appointment) (If Yes - send message)  Appointment offered? No  Type of order/referral and detailed reason for visit: gastroenterologist  Preference of office, provider, location: N/A  If referral order, have you been seen by this specialty before? No (If Yes, this issue or another issue? When? Where?  Can we respond through MyChart? Yes.

## 2024-10-30 ENCOUNTER — Inpatient Hospital Stay (HOSPITAL_COMMUNITY)
Admission: EM | Admit: 2024-10-30 | Discharge: 2024-11-01 | DRG: 439 | Disposition: A | Discharge status: Home / Self Care | Payer: PRIVATE HEALTH INSURANCE | Attending: Emergency Medicine | Admitting: Emergency Medicine

## 2024-10-30 ENCOUNTER — Other Ambulatory Visit: Payer: Self-pay

## 2024-10-30 ENCOUNTER — Encounter (HOSPITAL_COMMUNITY): Payer: Self-pay | Admitting: Emergency Medicine

## 2024-10-30 ENCOUNTER — Inpatient Hospital Stay (HOSPITAL_COMMUNITY): Payer: PRIVATE HEALTH INSURANCE

## 2024-10-30 ENCOUNTER — Emergency Department (HOSPITAL_COMMUNITY): Payer: PRIVATE HEALTH INSURANCE

## 2024-10-30 DIAGNOSIS — K219 Gastro-esophageal reflux disease without esophagitis: Secondary | ICD-10-CM | POA: Diagnosis present

## 2024-10-30 DIAGNOSIS — F1010 Alcohol abuse, uncomplicated: Secondary | ICD-10-CM | POA: Diagnosis present

## 2024-10-30 DIAGNOSIS — N179 Acute kidney failure, unspecified: Secondary | ICD-10-CM | POA: Diagnosis present

## 2024-10-30 DIAGNOSIS — K852 Alcohol induced acute pancreatitis without necrosis or infection: Secondary | ICD-10-CM | POA: Diagnosis not present

## 2024-10-30 DIAGNOSIS — F1290 Cannabis use, unspecified, uncomplicated: Secondary | ICD-10-CM | POA: Diagnosis present

## 2024-10-30 DIAGNOSIS — Z79899 Other long term (current) drug therapy: Secondary | ICD-10-CM | POA: Diagnosis not present

## 2024-10-30 DIAGNOSIS — F17210 Nicotine dependence, cigarettes, uncomplicated: Secondary | ICD-10-CM | POA: Diagnosis present

## 2024-10-30 DIAGNOSIS — E860 Dehydration: Secondary | ICD-10-CM | POA: Diagnosis present

## 2024-10-30 DIAGNOSIS — M109 Gout, unspecified: Secondary | ICD-10-CM | POA: Diagnosis present

## 2024-10-30 DIAGNOSIS — I10 Essential (primary) hypertension: Secondary | ICD-10-CM | POA: Diagnosis present

## 2024-10-30 DIAGNOSIS — Z1152 Encounter for screening for COVID-19: Secondary | ICD-10-CM

## 2024-10-30 LAB — URINALYSIS, ROUTINE W REFLEX MICROSCOPIC
Bacteria, UA: NONE SEEN
Bilirubin Urine: NEGATIVE
Glucose, UA: 50 mg/dL — AB
Ketones, ur: NEGATIVE mg/dL
Leukocytes,Ua: NEGATIVE
Nitrite: NEGATIVE
Protein, ur: 100 mg/dL — AB
Specific Gravity, Urine: 1.015 (ref 1.005–1.030)
pH: 5 (ref 5.0–8.0)

## 2024-10-30 LAB — URINE DRUG SCREEN
Amphetamines: NEGATIVE
Barbiturates: NEGATIVE
Benzodiazepines: NEGATIVE
Cocaine: NEGATIVE
Fentanyl: NEGATIVE
Methadone Scn, Ur: NEGATIVE
Opiates: POSITIVE — AB
Tetrahydrocannabinol: NEGATIVE

## 2024-10-30 LAB — CBC
HCT: 47 % (ref 39.0–52.0)
Hemoglobin: 15.8 g/dL (ref 13.0–17.0)
MCH: 31 pg (ref 26.0–34.0)
MCHC: 33.6 g/dL (ref 30.0–36.0)
MCV: 92.2 fL (ref 80.0–100.0)
Platelets: 442 K/uL — ABNORMAL HIGH (ref 150–400)
RBC: 5.1 MIL/uL (ref 4.22–5.81)
RDW: 13.2 % (ref 11.5–15.5)
WBC: 10.7 K/uL — ABNORMAL HIGH (ref 4.0–10.5)
nRBC: 0 % (ref 0.0–0.2)

## 2024-10-30 LAB — COMPREHENSIVE METABOLIC PANEL WITH GFR
ALT: 8 U/L (ref 0–44)
AST: 18 U/L (ref 15–41)
Albumin: 4.9 g/dL (ref 3.5–5.0)
Alkaline Phosphatase: 73 U/L (ref 38–126)
Anion gap: 20 — ABNORMAL HIGH (ref 5–15)
BUN: 39 mg/dL — ABNORMAL HIGH (ref 6–20)
CO2: 21 mmol/L — ABNORMAL LOW (ref 22–32)
Calcium: 10 mg/dL (ref 8.9–10.3)
Chloride: 97 mmol/L — ABNORMAL LOW (ref 98–111)
Creatinine, Ser: 4.48 mg/dL — ABNORMAL HIGH (ref 0.61–1.24)
GFR, Estimated: 16 mL/min — ABNORMAL LOW (ref >60)
Glucose, Bld: 126 mg/dL — ABNORMAL HIGH (ref 70–99)
Potassium: 3.7 mmol/L (ref 3.5–5.1)
Sodium: 138 mmol/L (ref 135–145)
Total Bilirubin: 0.7 mg/dL (ref 0.0–1.2)
Total Protein: 9.1 g/dL — ABNORMAL HIGH (ref 6.5–8.1)

## 2024-10-30 LAB — LIPASE, BLOOD: Lipase: 1201 U/L — ABNORMAL HIGH (ref 11–51)

## 2024-10-30 MED ORDER — THIAMINE HCL 100 MG/ML IJ SOLN: 100.0000 mg | Freq: Every day | INTRAMUSCULAR | Status: DC

## 2024-10-30 MED ORDER — SODIUM CHLORIDE 0.9 % IV BOLUS
1000.0000 mL | Freq: Once | INTRAVENOUS | Status: AC
  Administered 2024-10-30: 1000 mL via INTRAVENOUS

## 2024-10-30 MED ORDER — HEPARIN SODIUM (PORCINE) 5000 UNIT/ML IJ SOLN
5000.0000 [IU] | Freq: Three times a day (TID) | INTRAMUSCULAR | Status: DC
  Administered 2024-10-30 – 2024-11-01 (×5): 5000 [IU] via SUBCUTANEOUS
  Filled 2024-10-30 (×5): qty 1

## 2024-10-30 MED ORDER — ONDANSETRON HCL 4 MG PO TABS: 4.0000 mg | ORAL_TABLET | Freq: Four times a day (QID) | ORAL | Status: DC | PRN

## 2024-10-30 MED ORDER — ONDANSETRON HCL 4 MG/2ML IJ SOLN
4.0000 mg | Freq: Once | INTRAMUSCULAR | Status: AC
  Administered 2024-10-30: 4 mg via INTRAVENOUS
  Filled 2024-10-30: qty 2

## 2024-10-30 MED ORDER — LORAZEPAM 2 MG/ML IJ SOLN: 1.0000 mg | INTRAMUSCULAR | Status: DC | PRN

## 2024-10-30 MED ORDER — LORAZEPAM 1 MG PO TABS: 1.0000 mg | ORAL_TABLET | ORAL | Status: DC | PRN

## 2024-10-30 MED ORDER — LACTATED RINGERS IV SOLN: INTRAVENOUS | Status: AC

## 2024-10-30 MED ORDER — THIAMINE MONONITRATE 100 MG PO TABS
100.0000 mg | ORAL_TABLET | Freq: Every day | ORAL | Status: DC
  Administered 2024-10-30 – 2024-11-01 (×3): 100 mg via ORAL
  Filled 2024-10-30 (×3): qty 1

## 2024-10-30 MED ORDER — AMLODIPINE BESYLATE 10 MG PO TABS
10.0000 mg | ORAL_TABLET | Freq: Every day | ORAL | Status: DC
  Administered 2024-10-31 – 2024-11-01 (×2): 10 mg via ORAL
  Filled 2024-10-30 (×2): qty 1

## 2024-10-30 MED ORDER — HYDROMORPHONE HCL 1 MG/ML IJ SOLN
0.5000 mg | INTRAMUSCULAR | Status: DC | PRN
Start: 2024-10-30 — End: 2024-11-01
  Administered 2024-10-31: 0.5 mg via INTRAVENOUS
  Filled 2024-10-30: qty 0.5

## 2024-10-30 MED ORDER — PANTOPRAZOLE SODIUM 40 MG IV SOLR
40.0000 mg | Freq: Every day | INTRAVENOUS | Status: DC
  Administered 2024-10-30 – 2024-10-31 (×2): 40 mg via INTRAVENOUS
  Filled 2024-10-30 (×2): qty 10

## 2024-10-30 MED ORDER — FOLIC ACID 1 MG PO TABS
1.0000 mg | ORAL_TABLET | Freq: Every day | ORAL | Status: DC
  Administered 2024-10-30 – 2024-11-01 (×3): 1 mg via ORAL
  Filled 2024-10-30 (×3): qty 1

## 2024-10-30 MED ORDER — ONDANSETRON HCL 4 MG/2ML IJ SOLN: 4.0000 mg | Freq: Four times a day (QID) | INTRAMUSCULAR | Status: DC | PRN

## 2024-10-30 MED ORDER — ADULT MULTIVITAMIN W/MINERALS CH
1.0000 | ORAL_TABLET | Freq: Every day | ORAL | Status: DC
  Administered 2024-10-30 – 2024-11-01 (×3): 1 via ORAL
  Filled 2024-10-30 (×3): qty 1

## 2024-10-30 MED ORDER — MORPHINE SULFATE (PF) 4 MG/ML IV SOLN
4.0000 mg | Freq: Once | INTRAVENOUS | Status: AC
  Administered 2024-10-30: 4 mg via INTRAVENOUS
  Filled 2024-10-30: qty 1

## 2024-10-30 MED ORDER — BISACODYL 5 MG PO TBEC: 5.0000 mg | DELAYED_RELEASE_TABLET | Freq: Every day | ORAL | Status: DC | PRN

## 2024-10-30 NOTE — H&P (Addendum)
 History and Physical    Patient: Jesus Phillips FMW:[id number] DOB: [birth year] DOA: 10/30/2024 DOS: the patient was seen and examined on 10/30/2024 PCP: Vicci Barnie NOVAK, MD  Patient coming from: Home  Chief Complaint: No chief complaint on file.  HPI: Jesus Phillips is a 38 y.o. male with medical history significant daily alcohol use, htn gout, and history of pancreatitis 5 months ago.  The patient had the diagnosis made in the ED but he did not require admission to the hospital.  He reports that 3 days ago he started to have discomfort in his epigastric area.  He said it felt like his stomach was starting to swell up.  He recognized the symptoms as the pancreatitis that he had had many months ago.  The pain started after eating a fast food meal.  After that he basically stopped eating for the next couple of days.  He attempted to drink water but he ended up throwing up every time he drank a lot of water.  He had a few hydrocodone  from last time he had pancreatitis but when he ran out he was just taking ibuprofen around-the-clock.  Over the last 3 days he just felt worse and worse.  Today he just had no energy he could not get out of bed so that is when he decided to come into the ED.  His evaluation in the emergency department revealed a lipase of 1200.  The patient also had a creatinine of 4.6.  He was hydrated in the emergency department and already started to feel much better.  He was able to keep down some ice chips.  He had maybe 4 ounces of water which he says is the most he has been able to keep down in the last 3 days.  The patient declined a CT scan because he says he knows this is the same thing as before.  Will be admitted to the hospital service and will be treated for acute renal failure and alcoholic pancreatitis.  The patient's last drink was 3 days ago.  After he developed the pain he stopped drinking alcohol and eating food. He has not had trouble with the shakes or seizures or  significant withdrawal symptoms.  Review of Systems: As mentioned in the history of present illness. All other systems reviewed and are negative. Past Medical History:  Diagnosis Date   Gout    History reviewed. No pertinent surgical history. Social History:  reports that he has been smoking cigarettes. He has never used smokeless tobacco. He reports current alcohol use. He reports current drug use. Drug: Marijuana.  No Known Allergies  History reviewed. No pertinent family history.  Prior to Admission medications   Medication Sig Start Date End Date Taking? Authorizing Provider  allopurinol  (ZYLOPRIM ) 100 MG tablet Take 2 tablets (200 mg total) by mouth daily. 09/02/24  Yes Vicci Barnie NOVAK, MD  amLODipine  (NORVASC ) 10 MG tablet Take 1 tablet (10 mg total) by mouth daily. 09/02/24  Yes Vicci Barnie NOVAK, MD  colchicine  0.6 MG tablet TAB 2 TABS AT START OF GOUT FLARE THEN 1 TAB 2 HRS LATER THEN 1 TAB BY MOUTH DAILY FOR 7 DAYS. 09/04/23  Yes Danton Slough M, PA-C  fenofibrate  (TRICOR ) 145 MG tablet Take 1 tablet (145 mg total) by mouth daily. 09/02/24  Yes Vicci Barnie NOVAK, MD  ibuprofen (ADVIL) 800 MG tablet Take 800 mg by mouth every 8 (eight) hours as needed for mild pain (pain score 1-3) or moderate pain (  pain score 4-6).   Yes [provider]  ondansetron  (ZOFRAN ) 4 MG tablet Take 1 tablet (4 mg total) by mouth every 8 (eight) hours as needed for nausea or vomiting. 05/26/24  Yes Midge Golas, MD  pantoprazole  (PROTONIX ) 40 MG tablet TAKE 1 TABLET BY MOUTH EVERY DAY 09/06/24  Yes Vicci Barnie NOVAK, MD  valsartan  (DIOVAN ) 80 MG tablet Take 1 tablet (80 mg total) by mouth daily. 09/02/24  Yes Vicci Barnie NOVAK, MD    Physical Exam: Vitals:   10/30/24 1238 10/30/24 1245 10/30/24 1315 10/30/24 1355  BP: 114/86 119/80 124/87 (!) 132/100  Pulse: (!) 136 (!) 132 (!) 113 (!) 107  Resp: 16  15 (!) 23  Temp: 98.2 F (36.8 C)   98.1 F (36.7 C)  TempSrc: Oral   Oral  SpO2:  100% 99% 100% 99%   Physical Exam:  General: No acute distress, well developed, well nourished HEENT: Normocephalic, atraumatic, PERRL Cardiovascular: Normal rate and rhythm. Distal pulses intact. Pulmonary: Normal pulmonary effort, normal breath sounds Gastrointestinal: Nondistended abdomen, soft,  mild tenderness diffusely, normoactive bowel sounds Musculoskeletal:Normal ROM, no lower ext edema Lymphadenopathy: No cervical LAD. Skin: Skin is warm and dry. Neuro: No focal deficits noted, AAOx3. PSYCH: Attentive and cooperative  Data Reviewed:  Results for orders placed or performed during the hospital encounter of 10/30/24 (from the past 24 hours)  Lipase, blood     Status: Abnormal   Collection Time: 10/30/24  1:13 PM  Result Value Ref Range   Lipase 1,201 (H) 11 - 51 U/L  Comprehensive metabolic panel     Status: Abnormal   Collection Time: 10/30/24  1:13 PM  Result Value Ref Range   Sodium 138 135 - 145 mmol/L   Potassium 3.7 3.5 - 5.1 mmol/L   Chloride 97 (L) 98 - 111 mmol/L   CO2 21 (L) 22 - 32 mmol/L   Glucose, Bld 126 (H) 70 - 99 mg/dL   BUN 39 (H) 6 - 20 mg/dL   Creatinine, Ser 5.51 (H) 0.61 - 1.24 mg/dL   Calcium 89.9 8.9 - 89.6 mg/dL   Total Protein 9.1 (H) 6.5 - 8.1 g/dL   Albumin 4.9 3.5 - 5.0 g/dL   AST 18 15 - 41 U/L   ALT 8 0 - 44 U/L   Alkaline Phosphatase 73 38 - 126 U/L   Total Bilirubin 0.7 0.0 - 1.2 mg/dL   GFR, Estimated 16 (L) >60 mL/min   Anion gap 20 (H) 5 - 15  CBC     Status: Abnormal   Collection Time: 10/30/24  1:13 PM  Result Value Ref Range   WBC 10.7 (H) 4.0 - 10.5 K/uL   RBC 5.10 4.22 - 5.81 MIL/uL   Hemoglobin 15.8 13.0 - 17.0 g/dL   HCT 52.9 60.9 - 47.9 %   MCV 92.2 80.0 - 100.0 fL   MCH 31.0 26.0 - 34.0 pg   MCHC 33.6 30.0 - 36.0 g/dL   RDW 86.7 88.4 - 84.4 %   Platelets 442 (H) 150 - 400 K/uL   nRBC 0.0 0.0 - 0.2 %     Assessment and Plan: Alcoholic pancreatitis IV fluids, IV pain control and symptomatic care, ice chips    2.  Alcohol abuse - I did counsel the patient to quit He needs continued counseling. - Thiamine , multivitamin, folate supplementation - IV Protonix     3.  Acute renal failure/AKI - likely due to round the clock ibuprofen use plus dehydration.  Creatinine =  4.48. It was 1.07 five months ago - Hydrate aggressively - Hold all nephrotoxic agents - Monitor  4.  Hypertension - Resume Norvasc . Hold Losartan due to renal failure.  5.  THC use     Advance Care Planning:   Code Status: Full Code the patient names his mother and his significant other as a surrogate decision makers.  CODE STATUS discussion was deferred.  He will be full code by default  Consults: None  Family Communication: None  Severity of Illness: The appropriate patient status for this patient is INPATIENT. Inpatient status is judged to be reasonable and necessary in order to provide the required intensity of service to ensure the patient's safety. The patient's presenting symptoms, physical exam findings, and initial radiographic and laboratory data in the context of their chronic comorbidities is felt to place them at high risk for further clinical deterioration. Furthermore, it is not anticipated that the patient will be medically stable for discharge from the hospital within 2 midnights of admission.   * I certify that at the point of admission it is my clinical judgment that the patient will require inpatient hospital care spanning beyond 2 midnights from the point of admission due to high intensity of service, high risk for further deterioration and high frequency of surveillance required.*  Author: ARTHEA CHILD, MD 10/30/2024 4:00 PM  For on call review [email or web address].

## 2024-10-30 NOTE — ED Provider Notes (Signed)
 [facility] DEPARTMENT AT [facility] Provider Note   CSN: [id number] Arrival date & time: 10/30/24  1234     Patient presents with: No chief complaint on file.   Jesus Phillips is a 38 y.o. male.   The history is provided by the patient, medical records and a significant other. No language interpreter was used.     38 year old male with significant history of polysubstance use including tobacco use and alcohol use, GERD, presenting with complaint of abdominal pain.  Patient states for the past 3 days he has diffuse abdominal discomfort and with associate nausea, vomiting and feeling very dehydrated.  States he seems to be unable to keep anything down.  He has not urinated much because of it.  He states his abdominal pain has improved but the nausea still persist.  Girlfriend was worried that he may have pancreatitis as he had had in the past.  He does admits to regular alcohol use, last use was 3 days ago.  He also admits to marijuana use and last use was 3 days ago.  Prior to Admission medications   Medication Sig Start Date End Date Taking? Authorizing Provider  allopurinol  (ZYLOPRIM ) 100 MG tablet Take 2 tablets (200 mg total) by mouth daily. 09/02/24   Vicci Barnie NOVAK, MD  amLODipine  (NORVASC ) 10 MG tablet Take 1 tablet (10 mg total) by mouth daily. 09/02/24   Vicci Barnie NOVAK, MD  cetirizine  (ZYRTEC ) 10 MG tablet Take 1 tablet (10 mg total) by mouth daily. Patient not taking: Reported on 09/02/2024 02/26/24   Danton Jon HERO, PA-C  colchicine  0.6 MG tablet TAB 2 TABS AT START OF GOUT FLARE THEN 1 TAB 2 HRS LATER THEN 1 TAB BY MOUTH DAILY FOR 7 DAYS. 09/04/23   Danton Jon HERO, PA-C  fenofibrate  (TRICOR ) 145 MG tablet Take 1 tablet (145 mg total) by mouth daily. 09/02/24   Vicci Barnie NOVAK, MD  HYDROcodone -acetaminophen  (NORCO/VICODIN) 5-325 MG tablet Take 1 tablet by mouth every 6 (six) hours as needed for severe pain (pain score 7-10). 05/26/24   Midge Golas, MD  ondansetron  (ZOFRAN ) 4 MG tablet Take 1 tablet (4 mg total) by mouth every 8 (eight) hours as needed for nausea or vomiting. 05/26/24   Midge Golas, MD  pantoprazole  (PROTONIX ) 40 MG tablet TAKE 1 TABLET BY MOUTH EVERY DAY 09/06/24   Vicci Barnie NOVAK, MD  valsartan  (DIOVAN ) 80 MG tablet Take 1 tablet (80 mg total) by mouth daily. 09/02/24   Vicci Barnie NOVAK, MD    Allergies: Patient has no known allergies.    Review of Systems  All other systems reviewed and are negative.   Updated Vital Signs BP 114/86 (BP Location: Left Arm)   Pulse (!) 136   Temp 98.2 F (36.8 C) (Oral)   Resp 16   SpO2 100%   Physical Exam Constitutional:      General: He is not in acute distress.    Appearance: He is well-developed.  HENT:     Head: Atraumatic.  Eyes:     Conjunctiva/sclera: Conjunctivae normal.  Cardiovascular:     Rate and Rhythm: Tachycardia present.     Pulses: Normal pulses.     Heart sounds: Normal heart sounds.  Pulmonary:     Effort: Pulmonary effort is normal.     Breath sounds: Normal breath sounds. No wheezing, rhonchi or rales.  Abdominal:     Palpations: Abdomen is soft.     Tenderness: There is abdominal  tenderness (Mild diffuse tenderness no guarding no rebound tenderness.  Bowel sounds present).  Musculoskeletal:     Cervical back: Normal range of motion and neck supple.  Skin:    Findings: No rash.  Neurological:     Mental Status: He is alert.     (all labs ordered are listed, but only abnormal results are displayed) Labs Reviewed  LIPASE, BLOOD - Abnormal; Notable for the following components:      Result Value   Lipase 1,201 (*)    All other components within normal limits  COMPREHENSIVE METABOLIC PANEL WITH GFR - Abnormal; Notable for the following components:   Chloride 97 (*)    CO2 21 (*)    Glucose, Bld 126 (*)    BUN 39 (*)    Creatinine, Ser 4.48 (*)    Total Protein 9.1 (*)    GFR, Estimated 16 (*)    Anion gap 20 (*)     All other components within normal limits  CBC - Abnormal; Notable for the following components:   WBC 10.7 (*)    Platelets 442 (*)    All other components within normal limits  URINALYSIS, ROUTINE W REFLEX MICROSCOPIC  URINE DRUG SCREEN    EKG: EKG Interpretation Date/Time:  Saturday October 30 2024 12:46:31 EST Ventricular Rate:  127 PR Interval:  145 QRS Duration:  150 QT Interval:  364 QTC Calculation: 534 R Axis:   46  Text Interpretation: Sinus tachycardia IVCD, consider atypical LBBB Confirmed by Jesus Cough [id number]) on 10/30/2024 12:50:18 PM  Radiology: No results found.   .Critical Care  Performed by: Nivia Colon, PA-C Authorized by: Nivia Colon, PA-C   Critical care provider statement:    Critical care time (minutes):  30   Critical care was time spent personally by me on the following activities:  Development of treatment plan with patient or surrogate, discussions with consultants, evaluation of patient's response to treatment, examination of patient, ordering and review of laboratory studies, ordering and review of radiographic studies, ordering and performing treatments and interventions, pulse oximetry, re-evaluation of patient's condition and review of old charts    Medications Ordered in the ED  LORazepam (ATIVAN) tablet 1-4 mg (has no administration in time range)    Or  LORazepam (ATIVAN) injection 1-4 mg (has no administration in time range)  thiamine  (VITAMIN B1) tablet 100 mg (has no administration in time range)    Or  thiamine  (VITAMIN B1) injection 100 mg (has no administration in time range)  folic acid (FOLVITE) tablet 1 mg (has no administration in time range)  multivitamin with minerals tablet 1 tablet (has no administration in time range)  heparin injection 5,000 Units (has no administration in time range)  ondansetron  (ZOFRAN ) tablet 4 mg (has no administration in time range)    Or  ondansetron  (ZOFRAN ) injection 4 mg (has no  administration in time range)  bisacodyl (DULCOLAX) EC tablet 5 mg (has no administration in time range)  lactated ringers  infusion (has no administration in time range)  sodium chloride  0.9 % bolus 1,000 mL (has no administration in time range)  sodium chloride  0.9 % bolus 1,000 mL (1,000 mLs Intravenous New Bag/Given 10/30/24 1310)  ondansetron  (ZOFRAN ) injection 4 mg (4 mg Intravenous Given 10/30/24 1310)  morphine (PF) 4 MG/ML injection 4 mg (4 mg Intravenous Given 10/30/24 1310)  sodium chloride  0.9 % bolus 1,000 mL (1,000 mLs Intravenous New Bag/Given 10/30/24 1424)  Medical Decision Making Amount and/or Complexity of Data Reviewed Labs: ordered. Radiology: ordered.  Risk OTC drugs. Prescription drug management. Decision regarding hospitalization.   BP 114/86 (BP Location: Left Arm)   Pulse (!) 136   Temp 98.2 F (36.8 C) (Oral)   Resp 16   SpO2 100%   5:46 PM  38 year old male with significant history of polysubstance use including tobacco use and alcohol use, GERD, presenting with complaint of abdominal pain.  Patient states for the past 3 days he has diffuse abdominal discomfort and with associate nausea, vomiting and feeling very dehydrated.  States he seems to be unable to keep anything down.  He has not urinated much because of it.  He states his abdominal pain has improved but the nausea still persist.  Girlfriend was worried that he may have pancreatitis as he had had in the past.  He does admits to regular alcohol use, last use was 3 days ago.  He also admits to marijuana use and last use was 3 days ago.  Exam patient is resting comfortably in no acute discomfort.  However he is tachycardic and he does have some mild generalized abdominal tenderness.  -Labs ordered, independently viewed and interpreted by me.  Labs remarkable for lipase of 1200 suggestive of pancreatitis.  Creatinine of 4.48 which is a significant rise from his  baseline of 1.  Anticipate AKI causing renal impairment.  His anion gap is 20 likely starvation ketosis.  White count of 10.7 -The patient was maintained on a cardiac monitor.  I personally viewed and interpreted the cardiac monitored which showed an underlying rhythm of: Sinus tachycardia -Imaging including abdominal pelvis CT scan ordered but patient declined stating that his pain felt similar to prior pancreatitis and does not want to have additional CT scan -This patient presents to the ED for concern of abdominal pain, this involves an extensive number of treatment options, and is a complaint that carries with it a high risk of complications and morbidity.  The differential diagnosis includes alcohol induced pancreatitis, colitis, gastritis, appendicitis, cholecystitis, UTI, kidney stone, CHS -Co morbidities that complicate the patient evaluation includes polysubstance use, GERD -Treatment includes IV fluid, morphine, Zofran  -Reevaluation of the patient after these medicines showed that the patient improved -PCP office notes or outside notes reviewed -Discussion with specialist [facility] Dr. Claireborne who agrees to admit pt -Escalation to admission/observation considered: patient agreeable with hospital admission.      Final diagnoses:  Alcohol-induced acute pancreatitis, unspecified complication status  AKI (acute kidney injury)    ED Discharge Orders     None          Nivia Colon, PA-C 10/30/24 1606    Jesus Donnice PARAS, MD 10/31/24 [phone]

## 2024-10-30 NOTE — ED Triage Notes (Signed)
 Patient reports mid abdominal pain with nausea, vomiting. States he hasn't eaten since Wednesday and feels dehydrated. Denies diarrhea. States a previous visit with the same issue and felt relief with IV rehydration.

## 2024-10-30 NOTE — ED Notes (Signed)
 Pt stated can't void at this time, need some fluids informed the nurse

## 2024-10-31 LAB — COMPREHENSIVE METABOLIC PANEL WITH GFR
ALT: 6 U/L (ref 0–44)
AST: 18 U/L (ref 15–41)
Albumin: 3.8 g/dL (ref 3.5–5.0)
Alkaline Phosphatase: 63 U/L (ref 38–126)
Anion gap: 11 (ref 5–15)
BUN: 37 mg/dL — ABNORMAL HIGH (ref 6–20)
CO2: 24 mmol/L (ref 22–32)
Calcium: 8.6 mg/dL — ABNORMAL LOW (ref 8.9–10.3)
Chloride: 104 mmol/L (ref 98–111)
Creatinine, Ser: 2.39 mg/dL — ABNORMAL HIGH (ref 0.61–1.24)
GFR, Estimated: 35 mL/min — ABNORMAL LOW (ref >60)
Glucose, Bld: 96 mg/dL (ref 70–99)
Potassium: 3.7 mmol/L (ref 3.5–5.1)
Sodium: 139 mmol/L (ref 135–145)
Total Bilirubin: 0.7 mg/dL (ref 0.0–1.2)
Total Protein: 6.7 g/dL (ref 6.5–8.1)

## 2024-10-31 LAB — CBC
HCT: 35.4 % — ABNORMAL LOW (ref 39.0–52.0)
Hemoglobin: 11.7 g/dL — ABNORMAL LOW (ref 13.0–17.0)
MCH: 31.1 pg (ref 26.0–34.0)
MCHC: 33.1 g/dL (ref 30.0–36.0)
MCV: 94.1 fL (ref 80.0–100.0)
Platelets: 312 K/uL (ref 150–400)
RBC: 3.76 MIL/uL — ABNORMAL LOW (ref 4.22–5.81)
RDW: 13.3 % (ref 11.5–15.5)
WBC: 7.1 K/uL (ref 4.0–10.5)
nRBC: 0 % (ref 0.0–0.2)

## 2024-10-31 LAB — LIPASE, BLOOD: Lipase: 138 U/L — ABNORMAL HIGH (ref 11–51)

## 2024-10-31 LAB — MAGNESIUM: Magnesium: 2 mg/dL (ref 1.7–2.4)

## 2024-10-31 NOTE — Progress Notes (Signed)
 Progress Note   Patient: Jesus Phillips FMW:[id number] DOB: 09-16-1986 DOA: 10/30/2024     1 DOS: the patient was seen and examined on 10/31/2024   Brief hospital course: The patient has a past medical history significant for daily alcohol use, hypertension, gout, and alcoholic pancreatitis. The patient stated that 3 days prior to presentation he had onse to f abdominal bloating, pain, nausea and vomiting prior to his previous episode. He stopped eating for a couple of days. However, when he tried to drink water, he would vomit. He then came to the ED.   In the ED the patient's lipase was found to be 1200. His creatinine was 4.5.   The patient has been admitted to a medical bed. He was kept NPO and was given LR at 200 cc/hr. He has also received pain control and antiemetics. The patient states that his last drink was 10/27/2024. He states that he has not had significant withdrawals in the past, and has not had shakes or seizures.  On 10/31/2024 the patient states that he is feeling better. He is interested in advancing his diet to clears for now and maybe more later. IV fluids will be reduced to 125/hr.   Assessment and Plan:  Alcoholic pancreatitis The patient is feeling better, his lipase has decreased to 138 this morning. He is interested in trying a clear liquid diet. Will advance diet as tolerated and decrease IV fluid reate to 125.   Alcohol abuse  The patient has been counseled to receive help for his alcohol abuse. Thiamine , multivitamin, folate supplementation IV Protonix      Acute renal failure/AKI  Due to volume depletion and NSAID use.  Creatinine was 4.48 on admission. Baseline appears to be 1.07.  It is improved to 2.39 this morning. Reduce IV fluid rate. Hold all nephrotoxic agents Monitor   Hypertension  Resume Norvasc . Hold Losartan due to renal failure.  The patient is normotensive today.   THC use.  Noted.   I have seen the patient myself and have spent  36 minutes in his evaluation and care.   Subjective: The patient is awake, alert, and oriented x 3.  Physical Exam: Vitals:   10/30/24 1939 10/31/24 0404 10/31/24 1024 10/31/24 1405  BP: (!) 134/96 118/70 (!) 142/79 129/85  Pulse: 83 64 85 74  Resp: 18 18 16 16   Temp: 98.7 F (37.1 C) 98.2 F (36.8 C)  99 F (37.2 C)  TempSrc: Oral Oral  Oral  SpO2: 100% 98% 100% 100%  Weight:      Height:       Exam:  Constitutional:  The patient is awake, alert, and oriented x 3. No acute distress. Eyes:  pupils and irises appear normal Normal lids and conjunctivae ENMT:  grossly normal hearing  Lips appear normal external ears, nose appear normal Oropharynx: mucosa, tongue,posterior pharynx appear normal Neck:  neck appears normal, no masses, normal ROM, supple no thyromegaly Respiratory:  No increased work of breathing. No wheezes, rales, or rhonchi No tactile fremitus Cardiovascular:  Regular rate and rhythm No murmurs, ectopy, or gallups. No lateral PMI. No thrills. Abdomen:  Abdomen is soft, non-tender, non-distended No hernias, masses, or organomegaly Normoactive bowel sounds.  Musculoskeletal:  No cyanosis, clubbing, or edema Skin:  No rashes, lesions, ulcers palpation of skin: no induration or nodules Neurologic:  CN 2-12 intact Sensation all 4 extremities intact Psychiatric:  Mental status Mood, affect appropriate Orientation to person, place, time  judgment and insight appear intact  Data  Reviewed:  CBC, CMP, Lipase   Family Communication: None available  Disposition: Status is: Inpatient Remains inpatient appropriate because: Need to monitor electrolytes and renal function while receiving IV fluids to treat renal inpairment.  Planned Discharge Destination: Home    Time spent: 36 minutes  Author: Masai Kidd, DO 10/31/2024 2:16 PM  For on call review [email or web address].

## 2024-10-31 NOTE — Plan of Care (Signed)
   Problem: Education: Goal: Knowledge of General Education information will improve Description Including pain rating scale, medication(s)/side effects and non-pharmacologic comfort measures Outcome: Progressing

## 2024-10-31 NOTE — Progress Notes (Signed)
 Call received from lad at 0645 that patient HGB dropped from 15.8 to 11.7. Report passed on to on coming nurse. Patient is in stable condition.

## 2024-10-31 NOTE — Progress Notes (Signed)
 Patient is alert and verbally responsive. CT done . Complained of pain to abdomen, PRN pain medication given.

## 2024-11-01 LAB — BASIC METABOLIC PANEL WITH GFR
Anion gap: 11 (ref 5–15)
BUN: 16 mg/dL (ref 6–20)
CO2: 25 mmol/L (ref 22–32)
Calcium: 9.3 mg/dL (ref 8.9–10.3)
Chloride: 104 mmol/L (ref 98–111)
Creatinine, Ser: 0.85 mg/dL (ref 0.61–1.24)
GFR, Estimated: >60 mL/min (ref >60)
Glucose, Bld: 159 mg/dL — ABNORMAL HIGH (ref 70–99)
Potassium: 3.7 mmol/L (ref 3.5–5.1)
Sodium: 140 mmol/L (ref 135–145)

## 2024-11-01 LAB — CBC WITH DIFFERENTIAL/PLATELET
Abs Immature Granulocytes: 0.01 K/uL (ref 0.00–0.07)
Basophils Absolute: 0 K/uL (ref 0.0–0.1)
Basophils Relative: 0 %
Eosinophils Absolute: 0 K/uL (ref 0.0–0.5)
Eosinophils Relative: 1 %
HCT: 35.3 % — ABNORMAL LOW (ref 39.0–52.0)
Hemoglobin: 11.5 g/dL — ABNORMAL LOW (ref 13.0–17.0)
Immature Granulocytes: 0 %
Lymphocytes Relative: 46 %
Lymphs Abs: 2.2 K/uL (ref 0.7–4.0)
MCH: 30.7 pg (ref 26.0–34.0)
MCHC: 32.6 g/dL (ref 30.0–36.0)
MCV: 94.1 fL (ref 80.0–100.0)
Monocytes Absolute: 0.4 K/uL (ref 0.1–1.0)
Monocytes Relative: 9 %
Neutro Abs: 2.1 K/uL (ref 1.7–7.7)
Neutrophils Relative %: 44 %
Platelets: 331 K/uL (ref 150–400)
RBC: 3.75 MIL/uL — ABNORMAL LOW (ref 4.22–5.81)
RDW: 12.9 % (ref 11.5–15.5)
WBC: 4.8 K/uL (ref 4.0–10.5)
nRBC: 0 % (ref 0.0–0.2)

## 2024-11-01 MED ORDER — FOLIC ACID 1 MG PO TABS: 1.0000 mg | ORAL_TABLET | Freq: Every day | ORAL | 0 refills | Status: AC

## 2024-11-01 MED ORDER — VITAMIN B-1 100 MG PO TABS: 100.0000 mg | ORAL_TABLET | Freq: Every day | ORAL | 0 refills | Status: AC

## 2024-11-01 MED ORDER — ADULT MULTIVITAMIN W/MINERALS CH: 1.0000 | ORAL_TABLET | Freq: Every day | ORAL | 0 refills | Status: AC

## 2024-11-01 NOTE — Telephone Encounter (Signed)
 Jesus Phillips & spoke to the patient. Verified name & DOB. Inquired reason for gastroenterology referral. Patient stated that he was having stomach concerns but he has since gone to the hospital where he has been admitted. Informed patient that once he is discharged and comes in for a hospitlization follow-up with PCP a referral can be send IF still needed. Patient expressed verbal understanding of all discussed.

## 2024-11-01 NOTE — Discharge Summary (Signed)
 Physician Discharge Summary   Patient: Jesus Phillips MRN: [id number] DOB: 29-Sep-1986  Admit date:     10/30/2024  Discharge date: 11/01/2024  Discharge Physician: Brigida Bureau   PCP: Vicci Barnie NOVAK, MD   Recommendations at discharge:    Discharge to home. Follow up with PCP in 7-10 days. Stop alcohol. Seek help with cessation of alcohol.  Discharge Diagnoses: Principal Problem:   Alcoholic pancreatitis Active Problems:   Alcohol abuse   Essential hypertension   AKI (acute kidney injury)  Resolved Problems:   * No resolved hospital problems. [facility] Course: The patient has a past medical history significant for daily alcohol use, hypertension, gout, and alcoholic pancreatitis. The patient stated that 3 days prior to presentation he had onse to f abdominal bloating, pain, nausea and vomiting prior to his previous episode. He stopped eating for a couple of days. However, when he tried to drink water, he would vomit. He then came to the ED.    In the ED the patient's lipase was found to be 1200. His creatinine was 4.5.    The patient has been admitted to a medical bed. He was kept NPO and was given LR at 200 cc/hr. He has also received pain control and antiemetics. The patient states that his last drink was 10/27/2024. He states that he has not had significant withdrawals in the past, and has not had shakes or seizures.   On 10/31/2024 the patient states that he is feeling better. He is interested in advancing his diet to clears for now and maybe more later. IV fluids will be reduced to 125/hr.   On 11/01/2024 the patient's IV fluids were discontinued. His diet was advanced. He tolerated the diet well and stated that he was feeling better. He will be discharged to home today. The patient was strongly advised that his pancreatitis was likely due to his alcohol use, and that he should stop alcohol use to avoid more serious episodes of pancreatitis. TOC was consulted to help  the patient with resources for cessation of alcohol.  Assessment and Plan: Alcoholic pancreatitis The patient is feeling better, his lipase has decreased to 138 this morning. He is interested in trying a clear liquid diet. On 11/01/2024 the patient's diet was advanced and his IV fluids were discontinued. He is tolerating his diet well and states that he feels well enough to go home.    Alcohol abuse  The patient has been counseled to receive help for his alcohol abuse. Thiamine , multivitamin, folate supplementation is continued for discharge.    Acute renal failure/AKI  Due to volume depletion and NSAID use.  Creatinine was 4.48 on admission. Baseline appears to be 1.07.  It is improved to 0.89 this morning.   Hypertension  Resume Norvasc . Hold Losartan due to renal failure.  The patient is normotensive today.   THC use.  Noted.   Consultants: None Procedures performed: None  Disposition: Home Diet recommendation:  Discharge Diet Orders (From admission, onward)     Start     Ordered   11/01/24 0000  Diet - low sodium heart healthy        11/01/24 1144           Cardiac diet DISCHARGE MEDICATION: Allergies as of 11/01/2024   No Known Allergies      Medication List     STOP taking these medications    colchicine  0.6 MG tablet   ibuprofen 800 MG tablet Commonly known as: ADVIL  ondansetron  4 MG tablet Commonly known as: ZOFRAN        TAKE these medications    allopurinol  100 MG tablet Commonly known as: ZYLOPRIM  Take 2 tablets (200 mg total) by mouth daily.   amLODipine  10 MG tablet Commonly known as: NORVASC  Take 1 tablet (10 mg total) by mouth daily.   fenofibrate  145 MG tablet Commonly known as: Tricor  Take 1 tablet (145 mg total) by mouth daily.   folic acid 1 MG tablet Commonly known as: FOLVITE Take 1 tablet (1 mg total) by mouth daily. Start taking on: November 02, 2024   multivitamin with minerals Tabs tablet Take 1 tablet by mouth  daily. Start taking on: November 02, 2024   pantoprazole  40 MG tablet Commonly known as: PROTONIX  TAKE 1 TABLET BY MOUTH EVERY DAY   thiamine  100 MG tablet Commonly known as: Vitamin B-1 Take 1 tablet (100 mg total) by mouth daily. Start taking on: November 02, 2024   valsartan  80 MG tablet Commonly known as: DIOVAN  Take 1 tablet (80 mg total) by mouth daily.        Discharge Exam: Filed Weights   10/30/24 1853  Weight: 86.1 kg   Exam:  Constitutional:  The patient is awake, alert, and oriented x 3. No acute distress. Eyes:  pupils and irises appear normal Normal lids and conjunctivae ENMT:  grossly normal hearing  Lips appear normal external ears, nose appear normal Oropharynx: mucosa, tongue,posterior pharynx appear normal Neck:  neck appears normal, no masses, normal ROM, supple no thyromegaly Respiratory:  No increased work of breathing. No wheezes, rales, or rhonchi No tactile fremitus Cardiovascular:  Regular rate and rhythm No murmurs, ectopy, or gallups. No lateral PMI. No thrills. Abdomen:  Abdomen is soft, non-tender, non-distended No hernias, masses, or organomegaly Normoactive bowel sounds.  Musculoskeletal:  No cyanosis, clubbing, or edema Skin:  No rashes, lesions, ulcers palpation of skin: no induration or nodules Neurologic:  CN 2-12 intact Sensation all 4 extremities intact Psychiatric:  Mental status Mood, affect appropriate Orientation to person, place, time  judgment and insight appear intact   Condition at discharge: fair  The results of significant diagnostics from this hospitalization (including imaging, microbiology, ancillary and laboratory) are listed below for reference.   Imaging Studies: CT ABDOMEN PELVIS WO CONTRAST Result Date: 10/30/2024 CLINICAL DATA:  Suspected pancreatitis. EXAM: CT ABDOMEN AND PELVIS WITHOUT CONTRAST TECHNIQUE: Multidetector CT imaging of the abdomen and pelvis was performed following the  standard protocol without IV contrast. RADIATION DOSE REDUCTION: This exam was performed according to the departmental dose-optimization program which includes automated exposure control, adjustment of the mA and/or kV according to patient size and/or use of iterative reconstruction technique. COMPARISON:  May 25, 2024 FINDINGS: Lower chest: Mild atelectatic changes are seen within the bilateral lung bases. Hepatobiliary: No focal liver abnormality is seen. The gallbladder is moderately distended without evidence of gallstones, gallbladder wall thickening, or biliary dilatation. Pancreas: Mild peripancreatic inflammatory fat stranding is seen within the region of the pancreatic head. Spleen: Normal in size without focal abnormality. Adrenals/Urinary Tract: Adrenal glands are unremarkable. Kidneys are normal, without renal calculi, focal lesion, or hydronephrosis. The urinary bladder is poorly distended and subsequently limited in evaluation. Stomach/Bowel: Stomach is within normal limits. Appendix appears normal. No evidence of bowel wall dilatation. The segment of proximal duodenum just beyond the duodenal bulb is markedly thickened and inflamed. Peri-duodenal inflammatory fat stranding is also seen which extends to include the previously noted peripancreatic inflammatory fat stranding. Vascular/Lymphatic: No  significant vascular findings are present. There is mild periportal lymphadenopathy. Reproductive: Prostate is unremarkable. Other: No abdominal wall hernia or abnormality. No abdominopelvic ascites. Musculoskeletal: No acute or significant osseous findings. IMPRESSION: 1. Marked severity proximal duodenitis with associated periportal lymphadenopathy. 2. Mild peripancreatic inflammatory fat stranding which may be secondary to adjacent duodenitis. Correlation with pancreatic enzymes is recommended to exclude acute pancreatitis. Electronically Signed   By: Suzen Dials M.D.   On: 10/30/2024 23:46     Microbiology: Results for orders placed or performed during the hospital encounter of 10/18/21  Resp Panel by RT-PCR (Flu A&B, Covid) Nasopharyngeal Swab     Status: None   Collection Time: 10/18/21  4:38 PM   Specimen: Nasopharyngeal Swab; Nasopharyngeal(NP) swabs in vial transport medium  Result Value Ref Range Status   SARS Coronavirus 2 by RT PCR NEGATIVE NEGATIVE Final    Comment: (NOTE) SARS-CoV-2 target nucleic acids are NOT DETECTED.  The SARS-CoV-2 RNA is generally detectable in upper respiratory specimens during the acute phase of infection. The lowest concentration of SARS-CoV-2 viral copies this assay can detect is 138 copies/mL. A negative result does not preclude SARS-Cov-2 infection and should not be used as the sole basis for treatment or other patient management decisions. A negative result may occur with  improper specimen collection/handling, submission of specimen other than nasopharyngeal swab, presence of viral mutation(s) within the areas targeted by this assay, and inadequate number of viral copies(<138 copies/mL). A negative result must be combined with clinical observations, patient history, and epidemiological information. The expected result is Negative.  Fact Sheet for Patients:  bloggercourse.com  Fact Sheet for Healthcare Providers:  seriousbroker.it  This test is no t yet approved or cleared by the [place]  FDA and  has been authorized for detection and/or diagnosis of SARS-CoV-2 by FDA under an Emergency Use Authorization (EUA). This EUA will remain  in effect (meaning this test can be used) for the duration of the COVID-19 declaration under Section 564(b)(1) of the Act, 21 U.S.C.section 360bbb-3(b)(1), unless the authorization is terminated  or revoked sooner.       Influenza A by PCR NEGATIVE NEGATIVE Final   Influenza B by PCR NEGATIVE NEGATIVE Final    Comment: (NOTE) The Xpert  Xpress SARS-CoV-2/FLU/RSV plus assay is intended as an aid in the diagnosis of influenza from Nasopharyngeal swab specimens and should not be used as a sole basis for treatment. Nasal washings and aspirates are unacceptable for Xpert Xpress SARS-CoV-2/FLU/RSV testing.  Fact Sheet for Patients: bloggercourse.com  Fact Sheet for Healthcare Providers: seriousbroker.it  This test is not yet approved or cleared by the [place]  FDA and has been authorized for detection and/or diagnosis of SARS-CoV-2 by FDA under an Emergency Use Authorization (EUA). This EUA will remain in effect (meaning this test can be used) for the duration of the COVID-19 declaration under Section 564(b)(1) of the Act, 21 U.S.C. section 360bbb-3(b)(1), unless the authorization is terminated or revoked.  Performed at [facility], [address] [place], [place] [address]   Gastrointestinal Panel by PCR , Stool     Status: Abnormal   Collection Time: 10/18/21 11:18 PM   Specimen: Stool  Result Value Ref Range Status   Campylobacter species NOT DETECTED NOT DETECTED Final   Plesimonas shigelloides NOT DETECTED NOT DETECTED Final   Salmonella species NOT DETECTED NOT DETECTED Final   Yersinia enterocolitica NOT DETECTED NOT DETECTED Final   Vibrio species NOT DETECTED NOT DETECTED Final   Vibrio cholerae  NOT DETECTED NOT DETECTED Final   Enteroaggregative E coli (EAEC) NOT DETECTED NOT DETECTED Final   Enteropathogenic E coli (EPEC) DETECTED (A) NOT DETECTED Final    Comment: RESULT CALLED TO, READ BACK BY AND VERIFIED WITH: BRANDON BEERS 10/19/21  1243 MU    Enterotoxigenic E coli (ETEC) NOT DETECTED NOT DETECTED Final   Shiga like toxin producing E coli (STEC) NOT DETECTED NOT DETECTED Final   Shigella/Enteroinvasive E coli (EIEC) NOT DETECTED NOT DETECTED Final   Cryptosporidium NOT DETECTED NOT DETECTED Final   Cyclospora cayetanensis NOT DETECTED  NOT DETECTED Final   Entamoeba histolytica NOT DETECTED NOT DETECTED Final   Giardia lamblia DETECTED (A) NOT DETECTED Final   Adenovirus F40/41 NOT DETECTED NOT DETECTED Final   Astrovirus NOT DETECTED NOT DETECTED Final   Norovirus GI/GII NOT DETECTED NOT DETECTED Final   Rotavirus A NOT DETECTED NOT DETECTED Final   Sapovirus (I, II, IV, and V) NOT DETECTED NOT DETECTED Final    Comment: Performed at [facility], [address] [place], [place] [address]  C Difficile Quick Screen w PCR reflex     Status: None   Collection Time: 10/18/21 11:18 PM   Specimen: STOOL  Result Value Ref Range Status   C Diff antigen NEGATIVE NEGATIVE Final   C Diff toxin NEGATIVE NEGATIVE Final   C Diff interpretation No C. difficile detected.  Final    Comment: Performed at [facility], [address] [place], [place] [address]    Labs: CBC: Recent Labs  Lab 10/30/24 1313 10/31/24 0610 11/01/24 0905  WBC 10.7* 7.1 4.8  NEUTROABS  --   --  2.1  HGB 15.8 11.7* 11.5*  HCT 47.0 35.4* 35.3*  MCV 92.2 94.1 94.1  PLT 442* 312 331   Basic Metabolic Panel: Recent Labs  Lab 10/30/24 1313 10/31/24 0610 11/01/24 0905  NA 138 139 140  K 3.7 3.7 3.7  CL 97* 104 104  CO2 21* 24 25  GLUCOSE 126* 96 159*  BUN 39* 37* 16  CREATININE 4.48* 2.39* 0.85  CALCIUM 10.0 8.6* 9.3  MG  --  2.0  --    Liver Function Tests: Recent Labs  Lab 10/30/24 1313 10/31/24 0610  AST 18 18  ALT 8 6  ALKPHOS 73 63  BILITOT 0.7 0.7  PROT 9.1* 6.7  ALBUMIN 4.9 3.8   CBG: No results for input(s): GLUCAP in the last 168 hours.  Discharge time spent: greater than 30 minutes.  Signed: Harpreet Signore, DO [facility] 11/01/2024

## 2024-11-01 NOTE — Progress Notes (Signed)
   11/01/24 1059  TOC Brief Assessment  Insurance and Status Reviewed  Patient has primary care physician Yes  Home environment has been reviewed Single family home  Prior level of function: Independent with ADL's  Prior/Current Home Services No current home services  Social Drivers of Health Review SDOH reviewed no interventions necessary  Readmission risk has been reviewed Yes  Transition of care needs no transition of care needs at this time   Pt screened. IP CM consulted for substance use resources. Resources added to DC Summary. No further IP CM needs identified at this time.

## 2024-11-01 NOTE — Plan of Care (Signed)

## 2024-11-02 ENCOUNTER — Telehealth: Payer: Self-pay

## 2024-11-02 LAB — MISC LABCORP TEST (SEND OUT): Labcorp test code: 83935

## 2024-11-02 NOTE — Transitions of Care (Post Inpatient/ED Visit) (Signed)
   11/02/2024  Name: Jesus Phillips MRN: [id number] DOB: 03/12/1986  Today's TOC FU Call Status: Today's TOC FU Call Status:: Successful TOC FU Call Completed TOC FU Call Complete Date: 11/02/24  Patient's Name and Date of Birth confirmed. Name, DOB  Transition Care Management Follow-up Telephone Call Date of Discharge: 11/01/24 Discharge Facility: [facility]) Type of Discharge: Inpatient Admission Primary Inpatient Discharge Diagnosis:: alcoholic pancreatitis How have you been since you were released from the hospital?: Better Any questions or concerns?: No  Items Reviewed: Did you receive and understand the discharge instructions provided?: Yes Medications obtained,verified, and reconciled?: Yes (Medications Reviewed) (He still has to pick up the folic acid, multivitamins and thiamine  from the pharmacy and plans to do so today.) Any new allergies since your discharge?: No Dietary orders reviewed?: Yes Type of Diet Ordered:: heart healthy, low sodium Do you have support at home?: Yes Name of Support/Comfort Primary Source: He did not specify who.  Medications Reviewed Today: Medications Reviewed Today     Reviewed by Marvis Bradley, RN (Case Manager) on 11/02/24 at 1038  Med List Status: <None>   Medication Order Taking? Sig Documenting Provider Last Dose Status Informant  allopurinol  (ZYLOPRIM ) 100 MG tablet [id number]  Take 2 tablets (200 mg total) by mouth daily. Vicci Barnie NOVAK, MD  Active Self, Pharmacy Records  amLODipine  (NORVASC ) 10 MG tablet [id number]  Take 1 tablet (10 mg total) by mouth daily. Vicci Barnie NOVAK, MD  Active Self, Pharmacy Records  fenofibrate  (TRICOR ) 145 MG tablet [id number]  Take 1 tablet (145 mg total) by mouth daily. Vicci Barnie NOVAK, MD  Active Self, Pharmacy Records  folic acid (FOLVITE) 1 MG tablet [id number]  Take 1 tablet (1 mg total) by mouth daily.  Patient not taking: Reported on 11/02/2024   Swayze, Ava, DO  Active   Multiple  Vitamin (MULTIVITAMIN WITH MINERALS) TABS tablet [id number]  Take 1 tablet by mouth daily.  Patient not taking: Reported on 11/02/2024   Swayze, Ava, DO  Active   pantoprazole  (PROTONIX ) 40 MG tablet [id number]  TAKE 1 TABLET BY MOUTH EVERY DAY Vicci Barnie NOVAK, MD  Active Self, Pharmacy Records  thiamine  (VITAMIN B-1) 100 MG tablet [id number]  Take 1 tablet (100 mg total) by mouth daily.  Patient not taking: Reported on 11/02/2024   Swayze, Ava, DO  Active   valsartan  (DIOVAN ) 80 MG tablet [id number]  Take 1 tablet (80 mg total) by mouth daily. Vicci Barnie NOVAK, MD  Active Self, Pharmacy Records            Home Care and Equipment/Supplies: Were Home Health Services Ordered?: No Any new equipment or medical supplies ordered?: No  Functional Questionnaire: Do you need assistance with bathing/showering or dressing?: No Do you need assistance with meal preparation?: No Do you need assistance with eating?: No Do you have difficulty maintaining continence: No Do you need assistance with getting out of bed/getting out of a chair/moving?: No Do you have difficulty managing or taking your medications?: No  Follow up appointments reviewed: PCP Follow-up appointment confirmed?: Yes Date of PCP follow-up appointment?: 11/26/24 Follow-up Provider: [facility] Follow-up appointment confirmed?: NA Do you need transportation to your follow-up appointment?: No Do you understand care options if your condition(s) worsen?: Yes-patient verbalized understanding    SIGNATURE Bradley Marvis, RN

## 2024-11-26 ENCOUNTER — Ambulatory Visit: Payer: PRIVATE HEALTH INSURANCE | Attending: Internal Medicine | Admitting: Internal Medicine

## 2024-11-26 ENCOUNTER — Encounter: Payer: Self-pay | Admitting: Internal Medicine

## 2024-11-26 VITALS — BP 131/79 | HR 80 | Temp 98.2°F | Ht 75.0 in | Wt 202.0 lb

## 2024-11-26 DIAGNOSIS — Z8719 Personal history of other diseases of the digestive system: Secondary | ICD-10-CM

## 2024-11-26 DIAGNOSIS — F1020 Alcohol dependence, uncomplicated: Secondary | ICD-10-CM | POA: Diagnosis not present

## 2024-11-26 DIAGNOSIS — I10 Essential (primary) hypertension: Secondary | ICD-10-CM | POA: Diagnosis not present

## 2024-11-26 DIAGNOSIS — Z09 Encounter for follow-up examination after completed treatment for conditions other than malignant neoplasm: Secondary | ICD-10-CM

## 2024-11-26 DIAGNOSIS — F17200 Nicotine dependence, unspecified, uncomplicated: Secondary | ICD-10-CM | POA: Diagnosis not present

## 2024-11-26 NOTE — Patient Instructions (Addendum)
°  VISIT SUMMARY: You had a follow-up appointment today after your recent hospitalization for pancreatitis caused by alcohol use. You have made some positive changes, including reducing your alcohol intake and stopping the use of ibuprofen, which you believe contributed to your symptoms. You are also managing your blood pressure and cholesterol with medication and have support from your partner in monitoring your health.  YOUR PLAN: -ALCOHOL-INDUCED ACUTE PANCREATITIS: This condition is inflammation of the pancreas due to alcohol use. You have reduced your alcohol intake to two beers daily. It is important to stop drinking completely to prevent further damage. We discussed the risks of continued alcohol use, including cirrhosis, and talked about using naltrexone to help with cravings. Consider joining a treatment program or attending AA meetings for additional support.  -ALCOHOL USE DISORDER, SEVERE, DEPENDENCE: This is a severe condition where you are dependent on alcohol. You have reduced your intake to two beers daily, but it is crucial to stop drinking entirely. We discussed using naltrexone to help with cravings and the possibility of detox programs. You want to think about it. Consider joining a treatment program or attending AA meetings for additional support.  -ESSENTIAL HYPERTENSION: This is high blood pressure. Your blood pressure has improved to 131/79 mmHg with your current medications, amlodipine  and valsartan . The goal is to keep it at 130/80 mmHg or lower. Continue taking your medications, limit your salt intake, and monitor your blood pressure weekly.  -TOBACCO DEPENDENCE: This is an addiction to tobacco. You are currently smoking four to five cigarettes daily and want to quit. We can offer support and resources to help you quit smoking when you are ready.  INSTRUCTIONS: Please follow up with weekly blood pressure monitoring to ensure it stays at or below 130/80 mmHg. Consider joining a  treatment program or attending AA meetings to support your goal of stopping alcohol use. If you are ready to quit smoking, let us  know so we can provide you with resources and support.                      Contains text generated by Abridge.                                 Contains text generated by Abridge.

## 2024-11-26 NOTE — Progress Notes (Signed)
 Patient ID: Jesus Phillips, male    DOB: 1986/09/03  MRN: [id number]  CC: Hospitalization Follow-up (Hospitalization f/u. /No questions / concerns/No to flu vax.)   Subjective: Jesus Phillips is a 38 y.o. male who presents for chronic ds management. His concerns today include:  Patient with history of HTN, probable gout, tobacco dependence, ETOH use disorder, ETOH pancreatitis, HLD, GERD   Discussed the use of AI scribe software for clinical note transcription with the patient, who gave verbal consent to proceed.  History of Present Illness Jesus Phillips is a 38 year old male with alcoholic pancreatitis who presents for follow-up after recent hospitalization.  He was hospitalized from November 15th to 17th for pancreatitis attributed to alcohol use, marking a recurrence since a similar hospitalization in June. During the recent hospitalization, he received fluids and was discharged in stable condition. A CT scan showed inflammation in the pancreas and duodenum, and elevated lipase of 1200.   Today:  He attributes the recent flare-up to taking 800 mg ibuprofen for pain management at home, which led to increased pain and inability to keep food down, prompting hospital care. He has since stopped taking ibuprofen and adjusted his diet, avoiding foods like [employer], which he believes contributed to his symptoms.  He has significantly reduced his alcohol intake, now consuming two to three small bottles of beer daily, compared to previous consumption of hard liquor. He acknowledges a history of drinking without a specific trigger and is attempting to wean himself off alcohol. He has had discussions with family about his drinking habits and is supported by his partner in reducing alcohol intake.  HTN: He is currently taking amlodipine  10 mg and valsartan  80 mg for blood pressure management. No chest pain or shortness of breath and limits his salt intake.  Also confirms taking pantoprazole   for acid reflux, and fenofibrate  for cholesterol. He reports compliance with these medications and checks his blood pressure occasionally with the help of his partner.  Tob dep: He smokes four to five cigarettes a day, primarily when outside the home.     Patient Active Problem List   Diagnosis Date Noted   Alcoholic pancreatitis 10/30/2024   AKI (acute kidney injury) 10/18/2021   LFT elevation 10/18/2021   Tobacco dependence 06/28/2021   Essential hypertension 06/28/2021   Overweight (BMI 25.0-29.9) 06/28/2021   Gastroesophageal reflux disease without esophagitis 06/28/2021   History of gout 02/03/2020   Plantar fasciitis 02/03/2020   Alcohol abuse 02/03/2020     Medications Ordered Prior to Encounter[1]  Allergies[2]  Social History   Socioeconomic History   Marital status: Single    Spouse name: Not on file   Number of children: Not on file   Years of education: Not on file   Highest education level: Not on file  Occupational History   Not on file  Tobacco Use   Smoking status: Every Day    Current packs/day: 0.50    Types: Cigarettes   Smokeless tobacco: Never  Vaping Use   Vaping status: Never Used  Substance and Sexual Activity   Alcohol use: Yes   Drug use: Yes    Types: Marijuana   Sexual activity: Yes    Birth control/protection: Condom  Other Topics Concern   Not on file  Social History Narrative   Not on file   Social Drivers of Health   Tobacco Use: High Risk (11/26/2024)   Patient History    Smoking Tobacco Use: Every Day  Smokeless Tobacco Use: Never    Passive Exposure: Not on file  Financial Resource Strain: Not on file  Food Insecurity: No Food Insecurity (10/30/2024)   Epic    Worried About Programme Researcher, Broadcasting/film/video in the Last Year: Never true    Ran Out of Food in the Last Year: Never true  Transportation Needs: No Transportation Needs (10/30/2024)   Epic    Lack of Transportation (Medical): No    Lack of Transportation (Non-Medical):  No  Physical Activity: Not on file  Stress: Not on file  Social Connections: Moderately Integrated (10/30/2024)   Social Connection and Isolation Panel    Frequency of Communication with Friends and Family: More than three times a week    Frequency of Social Gatherings with Friends and Family: More than three times a week    Attends Religious Services: 1 to 4 times per year    Active Member of [employer] or Organizations: Yes    Attends Banker Meetings: 1 to 4 times per year    Marital Status: Never married  Intimate Partner Violence: Not At Risk (10/30/2024)   Epic    Fear of Current or Ex-Partner: No    Emotionally Abused: No    Physically Abused: No    Sexually Abused: No  Depression (PHQ2-9): Low Risk (09/02/2024)   Depression (PHQ2-9)    PHQ-2 Score: 2  Alcohol Screen: Not on file  Housing: Low Risk (10/30/2024)   Epic    Unable to Pay for Housing in the Last Year: No    Number of Times Moved in the Last Year: 0    Homeless in the Last Year: No  Utilities: Not At Risk (10/30/2024)   Epic    Threatened with loss of utilities: No  Health Literacy: Not on file    No family history on file.  No past surgical history on file.  ROS: Review of Systems Negative except as stated above  PHYSICAL EXAM: BP 131/79   Pulse 80   Temp 98.2 F (36.8 C) (Oral)   Ht 6' 3 (1.905 m)   Wt 202 lb (91.6 kg)   SpO2 100%   BMI 25.25 kg/m   Physical Exam   General appearance - alert, well appearing, and in no distress Mental status - normal mood, behavior, speech, dress, motor activity, and thought processes Chest - clear to auscultation, no wheezes, rales or rhonchi, symmetric air entry Heart - normal rate, regular rhythm, normal S1, S2, no murmurs, rubs, clicks or gallops Abdomen - soft, nontender, nondistended, no masses or organomegaly     Latest Ref Rng & Units 11/01/2024    9:05 AM 10/31/2024    6:10 AM 10/30/2024    1:13 PM  CMP  Glucose 70 - 99 mg/dL 840   96  873   BUN 6 - 20 mg/dL 16  37  39   Creatinine 0.61 - 1.24 mg/dL 9.14  7.60  5.51   Sodium 135 - 145 mmol/L 140  139  138   Potassium 3.5 - 5.1 mmol/L 3.7  3.7  3.7   Chloride 98 - 111 mmol/L 104  104  97   CO2 22 - 32 mmol/L 25  24  21    Calcium 8.9 - 10.3 mg/dL 9.3  8.6  89.9   Total Protein 6.5 - 8.1 g/dL  6.7  9.1   Total Bilirubin 0.0 - 1.2 mg/dL  0.7  0.7   Alkaline Phos 38 - 126 U/L  63  73   AST 15 - 41 U/L  18  18   ALT 0 - 44 U/L  6  8    Lipid Panel     Component Value Date/Time   CHOL 120 09/02/2024 0957   TRIG 70 09/02/2024 0957   HDL 69 09/02/2024 0957   CHOLHDL 1.7 09/02/2024 0957   LDLCALC 37 09/02/2024 0957    CBC    Component Value Date/Time   WBC 4.8 11/01/2024 0905   RBC 3.75 (L) 11/01/2024 0905   HGB 11.5 (L) 11/01/2024 0905   HGB 14.5 03/06/2023 0950   HCT 35.3 (L) 11/01/2024 0905   HCT 41.8 03/06/2023 0950   PLT 331 11/01/2024 0905   PLT 392 03/06/2023 0950   MCV 94.1 11/01/2024 0905   MCV 89 03/06/2023 0950   MCH 30.7 11/01/2024 0905   MCHC 32.6 11/01/2024 0905   RDW 12.9 11/01/2024 0905   RDW 12.0 03/06/2023 0950   LYMPHSABS 2.2 11/01/2024 0905   LYMPHSABS 2.1 03/06/2023 0950   MONOABS 0.4 11/01/2024 0905   EOSABS 0.0 11/01/2024 0905   EOSABS 0.0 03/06/2023 0950   BASOSABS 0.0 11/01/2024 0905   BASOSABS 0.0 03/06/2023 0950    ASSESSMENT AND PLAN: 1. Hospital discharge follow-up (Primary)  2. History of acute pancreatitis 3. Alcohol use disorder, severe, dependence (HCC) -- Commended him on cutting back but encouraged complete cessation of alcohol.  Discussed impacts of excessive alcohol use on health and risks of developing cirrhosis. - Discussed naltrexone for cravings.  Patient states he will think about it and will call to let me know if he decides to move forward with trying the medication. - Consider referral to treatment program or AA meetings.  Again patient states he wants to think about this.  4. Essential  hypertension Repeat blood pressure is better.  He will continue amlodipine  and valsartan   5. Tobacco dependence Strongly advised to quit.  He is not ready to give a trial of quitting.  Patient was given the opportunity to ask questions.  Patient verbalized understanding of the plan and was able to repeat key elements of the plan.   This documentation was completed using Paediatric nurse.  Any transcriptional errors are unintentional.  No orders of the defined types were placed in this encounter.    Requested Prescriptions    No prescriptions requested or ordered in this encounter    Return in about 4 months (around 03/27/2025).  Barnie Louder, MD, FACP     [1]  Current Outpatient Medications on File Prior to Visit  Medication Sig Dispense Refill   allopurinol  (ZYLOPRIM ) 100 MG tablet Take 2 tablets (200 mg total) by mouth daily. 180 tablet 1   amLODipine  (NORVASC ) 10 MG tablet Take 1 tablet (10 mg total) by mouth daily. 90 tablet 1   fenofibrate  (TRICOR ) 145 MG tablet Take 1 tablet (145 mg total) by mouth daily. 90 tablet 1   folic acid  (FOLVITE ) 1 MG tablet Take 1 tablet (1 mg total) by mouth daily. 30 tablet 0   Multiple Vitamin (MULTIVITAMIN WITH MINERALS) TABS tablet Take 1 tablet by mouth daily. 30 tablet 0   pantoprazole  (PROTONIX ) 40 MG tablet TAKE 1 TABLET BY MOUTH EVERY DAY 90 tablet 1   thiamine  (VITAMIN B-1) 100 MG tablet Take 1 tablet (100 mg total) by mouth daily. 30 tablet 0   valsartan  (DIOVAN ) 80 MG tablet Take 1 tablet (80 mg total) by mouth daily. 90 tablet 1   No current facility-administered  medications on file prior to visit.  [2] Not on File

## 2025-01-05 ENCOUNTER — Telehealth: Payer: Self-pay | Admitting: Internal Medicine

## 2025-01-05 NOTE — Telephone Encounter (Signed)
 Copied from CRM [id number]. Topic: Referral - Request for Referral >> Jan 05, 2025 12:17 PM   Taleah C wrote:  Did the patient discuss referral with their provider in the last year? No (If No - schedule appointment) (If Yes - send message)  Appointment offered? Yes  Type of order/referral and detailed reason for visit: Gastroenterologist, has been having stomach issues   Preference of office, provider, location: any  If referral order, have you been seen by this specialty before? No (If Yes, this issue or another issue? When? Where?  Can we respond through MyChart? No

## 2025-01-05 NOTE — Telephone Encounter (Signed)
 Called & spoke to the patient. Verified name & DOB. Inquired to clarify symptoms further. Reports reduced appetite and intermittent nausea. Experiencing abdominal pain X2 days. Pain scale is a 5 right now. Denies taking any OTC to help with the symptoms. Please advise if you would like to see the patient first. Thank you in advance.

## 2025-01-06 ENCOUNTER — Ambulatory Visit: Payer: PRIVATE HEALTH INSURANCE | Admitting: Internal Medicine

## 2025-01-06 NOTE — Telephone Encounter (Signed)
 Give UC visit with any available provider including mobile fleeta if needed.

## 2025-01-07 ENCOUNTER — Encounter: Payer: Self-pay | Admitting: *Deleted

## 2025-01-07 ENCOUNTER — Ambulatory Visit: Payer: Self-pay | Attending: *Deleted | Admitting: *Deleted

## 2025-01-07 VITALS — BP 119/81 | HR 104 | Temp 98.4°F | Ht 75.0 in | Wt 190.4 lb

## 2025-01-07 DIAGNOSIS — N179 Acute kidney failure, unspecified: Secondary | ICD-10-CM

## 2025-01-07 DIAGNOSIS — I10 Essential (primary) hypertension: Secondary | ICD-10-CM

## 2025-01-07 DIAGNOSIS — R634 Abnormal weight loss: Secondary | ICD-10-CM

## 2025-01-07 MED ORDER — ONDANSETRON 4 MG PO TBDP: 4.0000 mg | ORAL_TABLET | Freq: Three times a day (TID) | ORAL | 0 refills | Status: AC | PRN

## 2025-01-07 NOTE — Assessment & Plan Note (Signed)
 Felt to be due to volume depletion and nonsteroidal use creatinine was 4.48 on admission, baseline 1.07.  Resolved during hospitalization.  No GU complaints of concern.   Labs were done today. They will be reviewed and the patient will be notified

## 2025-01-07 NOTE — Patient Instructions (Addendum)
 Today we discussed your nausea and weight loss.  ( self-report of weight loss of 12 pounds.) Per chart review BMI in November 2025 was 23.73 today your BMI is 23.8 You have history of pancreatitis, related to alcohol use We discussed checking a CBC, CMP, lipase and a TSH. You are agreeable to having Zofran  on hand for nausea. We would like to talk to a GI specialist. Will refer you after we review your labs call the clinic if you have any questions or concerns

## 2025-01-07 NOTE — Telephone Encounter (Signed)
 Noted! Thank you

## 2025-01-07 NOTE — Assessment & Plan Note (Signed)
 At intake, blood pressure was under good control.  He reports that he takes his medication without difficulty or side effect. He is encouraged to continue a heart healthy diet, exercise daily and taking his medicines as previously prescribed

## 2025-01-07 NOTE — Progress Notes (Signed)
 "   Patient ID: Jesus Phillips, male    DOB: 1986/01/31  MRN: [id number]  CC: Acute Visit (No appetite X3 day/)   Subjective: Jesus Phillips is a 39 y.o. male who presents for discussion of 3-day history of anorexia following taking medications on an empty stomach which he says is a usual practice for him.  (He is accompanied by his girlfriend who is quite anxious about his health.  They have a contentious relationship)  Per Epic review, he was in the hospital from 11/15-11/17/2025 with a discharge diagnosis of alcoholic pancreatitis, and active problem list including alcohol abuse, essential hypertension, AKI.   His lipase was found to be 1200 and his creatinine was 4.5  He was kept n.p.o. and rehydrated with lactated Ringer 's at 200 cc an hour also received pain control and antiemetics. He felt better with gut rest and his diet was advanced with good toleration.  He was discharged 11/01/2024 with strong advice that his pancreatitis was due to his alcohol use and that he should stop alcohol use to avoid more serious episodes of pancreatitis. Lipase at time of discharge was 138  Significant other reports 12 pound weight loss over the last several weeks. Per chart review BMI today is 23.80.  November 2025 BMI was 23.73. She is concerned that something is wrong with him and he needs further evaluation including a GI consult  AKI:  Felt to be due to volume depletion and nonsteroidal use creatinine was 4.48 on admission, baseline 1.07.  Improved to 0.89 the morning of his discharge from the hospital  Hypertension: His Norvasc  was resumed at time of discharge but losartan was held due to renal failure.  He was normotensive at time of discharge from the hospital.  Today his blood pressure is 119/81.  Pertinent past medical history include: Alcohol abuse, essential hypertension, AKI, alcoholic pancreatitis    Patient Active Problem List   Diagnosis Date Noted   Alcoholic pancreatitis  10/30/2024   AKI (acute kidney injury) 10/18/2021   LFT elevation 10/18/2021   Tobacco dependence 06/28/2021   Essential hypertension 06/28/2021   Overweight (BMI 25.0-29.9) 06/28/2021   Gastroesophageal reflux disease without esophagitis 06/28/2021   History of gout 02/03/2020   Plantar fasciitis 02/03/2020   Alcohol abuse 02/03/2020     Medications Ordered Prior to Encounter[1]  Allergies[2]  Social History   Socioeconomic History   Marital status: Single    Spouse name: Not on file   Number of children: Not on file   Years of education: Not on file   Highest education level: Not on file  Occupational History   Not on file  Tobacco Use   Smoking status: Every Day    Current packs/day: 0.50    Types: Cigarettes   Smokeless tobacco: Never  Vaping Use   Vaping status: Never Used  Substance and Sexual Activity   Alcohol use: Yes   Drug use: Yes    Types: Marijuana   Sexual activity: Yes    Birth control/protection: Condom  Other Topics Concern   Not on file  Social History Narrative   Not on file   Social Drivers of Health   Tobacco Use: High Risk (11/26/2024)   Patient History    Smoking Tobacco Use: Every Day    Smokeless Tobacco Use: Never    Passive Exposure: Not on file  Financial Resource Strain: Not on file  Food Insecurity: No Food Insecurity (10/30/2024)   Epic    Worried About Running Out  of Food in the Last Year: Never true    Ran Out of Food in the Last Year: Never true  Transportation Needs: No Transportation Needs (10/30/2024)   Epic    Lack of Transportation (Medical): No    Lack of Transportation (Non-Medical): No  Physical Activity: Not on file  Stress: Not on file  Social Connections: Moderately Integrated (10/30/2024)   Social Connection and Isolation Panel    Frequency of Communication with Friends and Family: More than three times a week    Frequency of Social Gatherings with Friends and Family: More than three times a week     Attends Religious Services: 1 to 4 times per year    Active Member of [employer] or Organizations: Yes    Attends Banker Meetings: 1 to 4 times per year    Marital Status: Never married  Intimate Partner Violence: Not At Risk (10/30/2024)   Epic    Fear of Current or Ex-Partner: No    Emotionally Abused: No    Physically Abused: No    Sexually Abused: No  Depression (PHQ2-9): Low Risk (09/02/2024)   Depression (PHQ2-9)    PHQ-2 Score: 2  Alcohol Screen: Not on file  Housing: Low Risk (10/30/2024)   Epic    Unable to Pay for Housing in the Last Year: No    Number of Times Moved in the Last Year: 0    Homeless in the Last Year: No  Utilities: Not At Risk (10/30/2024)   Epic    Threatened with loss of utilities: No  Health Literacy: Not on file    No family history on file.  No past surgical history on file.  ROS: Review of Systems Negative except as stated above  PHYSICAL EXAM: BP 119/81 (BP Location: Left Arm, Patient Position: Sitting, Cuff Size: Normal)   Pulse (!) 104   Temp 98.4 F (36.9 C) (Oral)   Ht 6' 3 (1.905 m)   Wt 86.4 kg   SpO2 100%   BMI 23.80 kg/m   Physical Exam Vitals and nursing note reviewed.  Eyes:     Comments: Girlfriend is concerned that his sclera are icteric. No icterus noted today  Cardiovascular:     Rate and Rhythm: Normal rate and regular rhythm.  Pulmonary:     Effort: Pulmonary effort is normal.     Breath sounds: Normal breath sounds.  Abdominal:     General: Abdomen is flat. Bowel sounds are normal.     Palpations: Abdomen is soft.     Comments: No masses organomegaly, no rebound or peritonitis  Skin:    General: Skin is warm and dry.  Neurological:     Mental Status: He is alert. Mental status is at baseline.          Latest Ref Rng & Units 11/01/2024    9:05 AM 10/31/2024    6:10 AM 10/30/2024    1:13 PM  CMP  Glucose 70 - 99 mg/dL 840  96  873   BUN 6 - 20 mg/dL 16  37  39   Creatinine 0.61 - 1.24  mg/dL 9.14  7.60  5.51   Sodium 135 - 145 mmol/L 140  139  138   Potassium 3.5 - 5.1 mmol/L 3.7  3.7  3.7   Chloride 98 - 111 mmol/L 104  104  97   CO2 22 - 32 mmol/L 25  24  21    Calcium 8.9 - 10.3 mg/dL 9.3  8.6  10.0   Total Protein 6.5 - 8.1 g/dL  6.7  9.1   Total Bilirubin 0.0 - 1.2 mg/dL  0.7  0.7   Alkaline Phos 38 - 126 U/L  63  73   AST 15 - 41 U/L  18  18   ALT 0 - 44 U/L  6  8    Lipid Panel     Component Value Date/Time   CHOL 120 09/02/2024 0957   TRIG 70 09/02/2024 0957   HDL 69 09/02/2024 0957   CHOLHDL 1.7 09/02/2024 0957   LDLCALC 37 09/02/2024 0957    CBC    Component Value Date/Time   WBC 4.8 11/01/2024 0905   RBC 3.75 (L) 11/01/2024 0905   HGB 11.5 (L) 11/01/2024 0905   HGB 14.5 03/06/2023 0950   HCT 35.3 (L) 11/01/2024 0905   HCT 41.8 03/06/2023 0950   PLT 331 11/01/2024 0905   PLT 392 03/06/2023 0950   MCV 94.1 11/01/2024 0905   MCV 89 03/06/2023 0950   MCH 30.7 11/01/2024 0905   MCHC 32.6 11/01/2024 0905   RDW 12.9 11/01/2024 0905   RDW 12.0 03/06/2023 0950   LYMPHSABS 2.2 11/01/2024 0905   LYMPHSABS 2.1 03/06/2023 0950   MONOABS 0.4 11/01/2024 0905   EOSABS 0.0 11/01/2024 0905   EOSABS 0.0 03/06/2023 0950   BASOSABS 0.0 11/01/2024 0905   BASOSABS 0.0 03/06/2023 0950    Results for orders placed or performed during the hospital encounter of 10/30/24  Lipase, blood   Collection Time: 10/30/24  1:13 PM  Result Value Ref Range   Lipase 1,201 (H) 11 - 51 U/L  Comprehensive metabolic panel   Collection Time: 10/30/24  1:13 PM  Result Value Ref Range   Sodium 138 135 - 145 mmol/L   Potassium 3.7 3.5 - 5.1 mmol/L   Chloride 97 (L) 98 - 111 mmol/L   CO2 21 (L) 22 - 32 mmol/L   Glucose, Bld 126 (H) 70 - 99 mg/dL   BUN 39 (H) 6 - 20 mg/dL   Creatinine, Ser 5.51 (H) 0.61 - 1.24 mg/dL   Calcium 89.9 8.9 - 89.6 mg/dL   Total Protein 9.1 (H) 6.5 - 8.1 g/dL   Albumin 4.9 3.5 - 5.0 g/dL   AST 18 15 - 41 U/L   ALT 8 0 - 44 U/L   Alkaline  Phosphatase 73 38 - 126 U/L   Total Bilirubin 0.7 0.0 - 1.2 mg/dL   GFR, Estimated 16 (L) >60 mL/min   Anion gap 20 (H) 5 - 15  CBC   Collection Time: 10/30/24  1:13 PM  Result Value Ref Range   WBC 10.7 (H) 4.0 - 10.5 K/uL   RBC 5.10 4.22 - 5.81 MIL/uL   Hemoglobin 15.8 13.0 - 17.0 g/dL   HCT 52.9 60.9 - 47.9 %   MCV 92.2 80.0 - 100.0 fL   MCH 31.0 26.0 - 34.0 pg   MCHC 33.6 30.0 - 36.0 g/dL   RDW 86.7 88.4 - 84.4 %   Platelets 442 (H) 150 - 400 K/uL   nRBC 0.0 0.0 - 0.2 %  Urinalysis, Routine w reflex microscopic -Urine, Clean Catch   Collection Time: 10/30/24  8:42 PM  Result Value Ref Range   Color, Urine YELLOW YELLOW   APPearance CLOUDY (A) CLEAR   Specific Gravity, Urine 1.015 1.005 - 1.030   pH 5.0 5.0 - 8.0   Glucose, UA 50 (A) NEGATIVE mg/dL   Hgb urine dipstick SMALL (A)  NEGATIVE   Bilirubin Urine NEGATIVE NEGATIVE   Ketones, ur NEGATIVE NEGATIVE mg/dL   Protein, ur 899 (A) NEGATIVE mg/dL   Nitrite NEGATIVE NEGATIVE   Leukocytes,Ua NEGATIVE NEGATIVE   RBC / HPF 0-5 0 - 5 RBC/hpf   WBC, UA 21-50 0 - 5 WBC/hpf   Bacteria, UA NONE SEEN NONE SEEN   Squamous Epithelial / HPF 0-5 0 - 5 /HPF   Mucus PRESENT    Hyaline Casts, UA PRESENT   Urine Drug Screen   Collection Time: 10/30/24  8:42 PM  Result Value Ref Range   Opiates POSITIVE (A) NEGATIVE   Cocaine NEGATIVE NEGATIVE   Benzodiazepines NEGATIVE NEGATIVE   Amphetamines NEGATIVE NEGATIVE   Tetrahydrocannabinol NEGATIVE NEGATIVE   Barbiturates NEGATIVE NEGATIVE   Methadone Scn, Ur NEGATIVE NEGATIVE   Fentanyl  NEGATIVE NEGATIVE  Lipase, blood   Collection Time: 10/31/24  6:10 AM  Result Value Ref Range   Lipase 138 (H) 11 - 51 U/L  Magnesium    Collection Time: 10/31/24  6:10 AM  Result Value Ref Range   Magnesium  2.0 1.7 - 2.4 mg/dL  Comprehensive metabolic panel   Collection Time: 10/31/24  6:10 AM  Result Value Ref Range   Sodium 139 135 - 145 mmol/L   Potassium 3.7 3.5 - 5.1 mmol/L   Chloride 104  98 - 111 mmol/L   CO2 24 22 - 32 mmol/L   Glucose, Bld 96 70 - 99 mg/dL   BUN 37 (H) 6 - 20 mg/dL   Creatinine, Ser 7.60 (H) 0.61 - 1.24 mg/dL   Calcium 8.6 (L) 8.9 - 10.3 mg/dL   Total Protein 6.7 6.5 - 8.1 g/dL   Albumin 3.8 3.5 - 5.0 g/dL   AST 18 15 - 41 U/L   ALT 6 0 - 44 U/L   Alkaline Phosphatase 63 38 - 126 U/L   Total Bilirubin 0.7 0.0 - 1.2 mg/dL   GFR, Estimated 35 (L) >60 mL/min   Anion gap 11 5 - 15  CBC   Collection Time: 10/31/24  6:10 AM  Result Value Ref Range   WBC 7.1 4.0 - 10.5 K/uL   RBC 3.76 (L) 4.22 - 5.81 MIL/uL   Hemoglobin 11.7 (L) 13.0 - 17.0 g/dL   HCT 64.5 (L) 60.9 - 47.9 %   MCV 94.1 80.0 - 100.0 fL   MCH 31.1 26.0 - 34.0 pg   MCHC 33.1 30.0 - 36.0 g/dL   RDW 86.6 88.4 - 84.4 %   Platelets 312 150 - 400 K/uL   nRBC 0.0 0.0 - 0.2 %  Miscellaneous [facility] test (send-out)   Collection Time: 10/31/24  6:10 AM  Result Value Ref Range   [facility] test code [id number]    [facility] test name [id number]    Source ([facility]) SST    Misc [facility] result COMMENT   CBC with Differential/Platelet   Collection Time: 11/01/24  9:05 AM  Result Value Ref Range   WBC 4.8 4.0 - 10.5 K/uL   RBC 3.75 (L) 4.22 - 5.81 MIL/uL   Hemoglobin 11.5 (L) 13.0 - 17.0 g/dL   HCT 64.6 (L) 60.9 - 47.9 %   MCV 94.1 80.0 - 100.0 fL   MCH 30.7 26.0 - 34.0 pg   MCHC 32.6 30.0 - 36.0 g/dL   RDW 87.0 88.4 - 84.4 %   Platelets 331 150 - 400 K/uL   nRBC 0.0 0.0 - 0.2 %   Neutrophils Relative % 44 %   Neutro Abs 2.1 1.7 -  7.7 K/uL   Lymphocytes Relative 46 %   Lymphs Abs 2.2 0.7 - 4.0 K/uL   Monocytes Relative 9 %   Monocytes Absolute 0.4 0.1 - 1.0 K/uL   Eosinophils Relative 1 %   Eosinophils Absolute 0.0 0.0 - 0.5 K/uL   Basophils Relative 0 %   Basophils Absolute 0.0 0.0 - 0.1 K/uL   Immature Granulocytes 0 %   Abs Immature Granulocytes 0.01 0.00 - 0.07 K/uL  Basic metabolic panel   Collection Time: 11/01/24  9:05 AM  Result Value Ref Range   Sodium 140 135 - 145 mmol/L    Potassium 3.7 3.5 - 5.1 mmol/L   Chloride 104 98 - 111 mmol/L   CO2 25 22 - 32 mmol/L   Glucose, Bld 159 (H) 70 - 99 mg/dL   BUN 16 6 - 20 mg/dL   Creatinine, Ser 9.14 0.61 - 1.24 mg/dL   Calcium 9.3 8.9 - 89.6 mg/dL   GFR, Estimated >39 >39 mL/min   Anion gap 11 5 - 15     ASSESSMENT AND PLAN:  Assessment & Plan Weight loss With history of alcohol use disorder and alcoholic pancreatitis. As above significant other reports 12 pound weight loss over a short period of time.  She voices concern that something is significantly wrong with him and she is asking for further testing as well as a GI consult. Per epic review BMI today is 23.80.  In November BMI was 23.78. Because the self-report of significant weight loss we will draw a panel for lab including CBC, CMP, lipase, TSH.  Will review labs prior to GI referral  Orders:   CBC with Differential   Comprehensive metabolic panel with GFR   Lipase   TSH  AKI (acute kidney injury) Felt to be due to volume depletion and nonsteroidal use creatinine was 4.48 on admission, baseline 1.07.  Resolved during hospitalization.  No GU complaints of concern.   Labs were done today. They will be reviewed and the patient will be notified  Essential hypertension At intake, blood pressure was under good control.  He reports that he takes his medication without difficulty or side effect. He is encouraged to continue a heart healthy diet, exercise daily and taking his medicines as previously prescribed         Patient was given the opportunity to ask questions.  Patient verbalized understanding of the plan and was able to repeat key elements of the plan.   This documentation was completed using Paediatric nurse.  Any transcriptional errors are unintentional.     Requested Prescriptions   Signed Prescriptions Disp Refills   ondansetron  (ZOFRAN -ODT) 4 MG disintegrating tablet 20 tablet 0    Sig: Take 1 tablet (4 mg total)  by mouth every 8 (eight) hours as needed for nausea or vomiting.    No follow-ups on file.  Lonn Im H, NP     [1]  Current Outpatient Medications on File Prior to Visit  Medication Sig Dispense Refill   allopurinol  (ZYLOPRIM ) 100 MG tablet Take 2 tablets (200 mg total) by mouth daily. 180 tablet 1   amLODipine  (NORVASC ) 10 MG tablet Take 1 tablet (10 mg total) by mouth daily. 90 tablet 1   fenofibrate  (TRICOR ) 145 MG tablet Take 1 tablet (145 mg total) by mouth daily. 90 tablet 1   folic acid  (FOLVITE ) 1 MG tablet Take 1 tablet (1 mg total) by mouth daily. 30 tablet 0   Multiple Vitamin (MULTIVITAMIN WITH MINERALS) TABS  tablet Take 1 tablet by mouth daily. 30 tablet 0   pantoprazole  (PROTONIX ) 40 MG tablet TAKE 1 TABLET BY MOUTH EVERY DAY 90 tablet 1   thiamine  (VITAMIN B-1) 100 MG tablet Take 1 tablet (100 mg total) by mouth daily. 30 tablet 0   valsartan  (DIOVAN ) 80 MG tablet Take 1 tablet (80 mg total) by mouth daily. 90 tablet 1   No current facility-administered medications on file prior to visit.  [2] No Known Allergies  "

## 2025-01-08 ENCOUNTER — Ambulatory Visit: Payer: Self-pay | Admitting: *Deleted

## 2025-01-08 DIAGNOSIS — R634 Abnormal weight loss: Secondary | ICD-10-CM

## 2025-01-08 LAB — LIPASE: Lipase: 113 U/L — ABNORMAL HIGH (ref 13–78)

## 2025-01-08 LAB — COMPREHENSIVE METABOLIC PANEL WITH GFR
ALT: 12 [IU]/L (ref 0–44)
AST: 17 [IU]/L (ref 0–40)
Albumin: 5.6 g/dL — ABNORMAL HIGH (ref 4.1–5.1)
Alkaline Phosphatase: 75 [IU]/L (ref 47–123)
BUN/Creatinine Ratio: 19 (ref 9–20)
BUN: 22 mg/dL — ABNORMAL HIGH (ref 6–20)
Bilirubin Total: 0.6 mg/dL (ref 0.0–1.2)
CO2: 20 mmol/L (ref 20–29)
Calcium: 10.8 mg/dL — ABNORMAL HIGH (ref 8.7–10.2)
Chloride: 97 mmol/L (ref 96–106)
Creatinine, Ser: 1.14 mg/dL (ref 0.76–1.27)
Globulin, Total: 3.1 g/dL (ref 1.5–4.5)
Glucose: 94 mg/dL (ref 70–99)
Potassium: 4.2 mmol/L (ref 3.5–5.2)
Sodium: 139 mmol/L (ref 134–144)
Total Protein: 8.7 g/dL — ABNORMAL HIGH (ref 6.0–8.5)
eGFR: 85 mL/min/{1.73_m2}

## 2025-01-08 LAB — CBC WITH DIFFERENTIAL/PLATELET
Basophils Absolute: 0 10*3/uL (ref 0.0–0.2)
Basos: 0 %
EOS (ABSOLUTE): 0 10*3/uL (ref 0.0–0.4)
Eos: 0 %
Hematocrit: 46.9 % (ref 37.5–51.0)
Hemoglobin: 15.7 g/dL (ref 13.0–17.7)
Immature Grans (Abs): 0 10*3/uL (ref 0.0–0.1)
Immature Granulocytes: 0 %
Lymphocytes Absolute: 2.7 10*3/uL (ref 0.7–3.1)
Lymphs: 29 %
MCH: 31 pg (ref 26.6–33.0)
MCHC: 33.5 g/dL (ref 31.5–35.7)
MCV: 93 fL (ref 79–97)
Monocytes Absolute: 0.5 10*3/uL (ref 0.1–0.9)
Monocytes: 5 %
Neutrophils Absolute: 6.1 10*3/uL (ref 1.4–7.0)
Neutrophils: 66 %
Platelets: 461 10*3/uL — ABNORMAL HIGH (ref 150–450)
RBC: 5.06 x10E6/uL (ref 4.14–5.80)
RDW: 11.9 % (ref 11.6–15.4)
WBC: 9.3 10*3/uL (ref 3.4–10.8)

## 2025-01-08 LAB — TSH: TSH: 1.35 u[IU]/mL (ref 0.450–4.500)

## 2025-01-20 ENCOUNTER — Other Ambulatory Visit: Payer: Self-pay | Admitting: *Deleted

## 2025-01-20 ENCOUNTER — Encounter: Payer: Self-pay | Admitting: *Deleted

## 2025-01-20 NOTE — Progress Notes (Unsigned)
 During supervision with Dr Vicci we discussed worinf up his mildly elevated Calcium.  I have sent him a request via [facility] and placed the future lab

## 2025-03-29 ENCOUNTER — Ambulatory Visit: Payer: Self-pay | Admitting: Internal Medicine
